# Patient Record
Sex: Male | Born: 1960 | Race: White | Hispanic: No | Marital: Married | Smoking: Never smoker
Health system: Southern US, Community
[De-identification: ages and names within clinical notes are randomized; demographics above are authoritative.]

## PROBLEM LIST (undated history)

## (undated) DIAGNOSIS — E7800 Pure hypercholesterolemia, unspecified: Secondary | ICD-10-CM

## (undated) DIAGNOSIS — I10 Essential (primary) hypertension: Secondary | ICD-10-CM

## (undated) DIAGNOSIS — M722 Plantar fascial fibromatosis: Secondary | ICD-10-CM

## (undated) DIAGNOSIS — M5126 Other intervertebral disc displacement, lumbar region: Secondary | ICD-10-CM

## (undated) HISTORY — PX: CARDIAC CATHETERIZATION: SHX172

## (undated) HISTORY — DX: Other intervertebral disc displacement, lumbar region: M51.26

## (undated) HISTORY — PX: INGUINAL LYMPH NODE BIOPSY: SHX5865

---

## 1999-03-06 ENCOUNTER — Inpatient Hospital Stay (HOSPITAL_COMMUNITY): Admission: EM | Admit: 1999-03-06 | Discharge: 1999-03-07 | Payer: Self-pay | Admitting: Emergency Medicine

## 2002-01-21 ENCOUNTER — Encounter: Admission: RE | Admit: 2002-01-21 | Discharge: 2002-01-21 | Payer: Self-pay | Admitting: Internal Medicine

## 2002-01-21 ENCOUNTER — Encounter: Payer: Self-pay | Admitting: Internal Medicine

## 2005-04-29 ENCOUNTER — Ambulatory Visit: Payer: Self-pay | Admitting: Internal Medicine

## 2005-05-05 ENCOUNTER — Ambulatory Visit: Payer: Self-pay | Admitting: Internal Medicine

## 2005-09-30 ENCOUNTER — Ambulatory Visit: Payer: Self-pay | Admitting: Internal Medicine

## 2006-05-04 ENCOUNTER — Ambulatory Visit: Payer: Self-pay | Admitting: Internal Medicine

## 2006-05-20 ENCOUNTER — Ambulatory Visit: Payer: Self-pay | Admitting: Internal Medicine

## 2006-06-18 ENCOUNTER — Ambulatory Visit: Payer: Self-pay | Admitting: Internal Medicine

## 2006-06-18 LAB — CONVERTED CEMR LAB
ALT: 22 units/L (ref 0–40)
AST: 17 units/L (ref 0–37)
Chol/HDL Ratio, serum: 5
Cholesterol: 193 mg/dL (ref 0–200)
HDL: 38.3 mg/dL — ABNORMAL LOW (ref >39.0)
LDL Cholesterol: 132 mg/dL — ABNORMAL HIGH (ref 0–99)
Triglyceride fasting, serum: 116 mg/dL (ref 0–149)
VLDL: 23 mg/dL (ref 0–40)

## 2006-10-03 ENCOUNTER — Emergency Department (HOSPITAL_COMMUNITY): Admission: EM | Admit: 2006-10-03 | Discharge: 2006-10-03 | Payer: Self-pay | Admitting: Emergency Medicine

## 2007-07-14 ENCOUNTER — Ambulatory Visit: Payer: Self-pay | Admitting: Internal Medicine

## 2007-07-14 LAB — CONVERTED CEMR LAB
ALT: 22 units/L (ref 0–53)
AST: 20 units/L (ref 0–37)
Albumin: 4.4 g/dL (ref 3.5–5.2)
Alkaline Phosphatase: 46 units/L (ref 39–117)
BUN: 17 mg/dL (ref 6–23)
Basophils Absolute: 0 10*3/uL (ref 0.0–0.1)
Basophils Relative: 0.6 % (ref 0.0–1.0)
Bilirubin Urine: NEGATIVE
Bilirubin, Direct: 0.2 mg/dL (ref 0.0–0.3)
CO2: 32 meq/L (ref 19–32)
Calcium: 9.8 mg/dL (ref 8.4–10.5)
Chloride: 101 meq/L (ref 96–112)
Cholesterol: 250 mg/dL (ref 0–200)
Creatinine, Ser: 1.2 mg/dL (ref 0.4–1.5)
Direct LDL: 150.1 mg/dL
Eosinophils Absolute: 0.1 10*3/uL (ref 0.0–0.6)
Eosinophils Relative: 2.4 % (ref 0.0–5.0)
GFR calc Af Amer: 85 mL/min
GFR calc non Af Amer: 70 mL/min
Glucose, Bld: 105 mg/dL — ABNORMAL HIGH (ref 70–99)
HCT: 42.4 % (ref 39.0–52.0)
HDL: 36.9 mg/dL — ABNORMAL LOW (ref >39.0)
Hemoglobin, Urine: NEGATIVE
Hemoglobin: 14.9 g/dL (ref 13.0–17.0)
Ketones, ur: NEGATIVE mg/dL
Leukocytes, UA: NEGATIVE
Lymphocytes Relative: 29 % (ref 12.0–46.0)
MCHC: 35.2 g/dL (ref 30.0–36.0)
MCV: 89.3 fL (ref 78.0–100.0)
Monocytes Absolute: 0.4 10*3/uL (ref 0.2–0.7)
Monocytes Relative: 8.6 % (ref 3.0–11.0)
Neutro Abs: 3.2 10*3/uL (ref 1.4–7.7)
Neutrophils Relative %: 59.4 % (ref 43.0–77.0)
Nitrite: NEGATIVE
Platelets: 199 10*3/uL (ref 150–400)
Potassium: 4.7 meq/L (ref 3.5–5.1)
RBC: 4.75 M/uL (ref 4.22–5.81)
RDW: 12 % (ref 11.5–14.6)
Sodium: 139 meq/L (ref 135–145)
Specific Gravity, Urine: 1.01 (ref 1.000–1.03)
TSH: 1.54 microintl units/mL (ref 0.35–5.50)
Total Bilirubin: 0.9 mg/dL (ref 0.3–1.2)
Total CHOL/HDL Ratio: 6.8
Total Protein, Urine: NEGATIVE mg/dL
Total Protein: 7.3 g/dL (ref 6.0–8.3)
Triglycerides: 241 mg/dL (ref 0–149)
Urine Glucose: NEGATIVE mg/dL
Urobilinogen, UA: 0.2 (ref 0.0–1.0)
VLDL: 48 mg/dL — ABNORMAL HIGH (ref 0–40)
WBC: 5.2 10*3/uL (ref 4.5–10.5)
pH: 6.5 (ref 5.0–8.0)

## 2007-07-16 ENCOUNTER — Encounter: Payer: Self-pay | Admitting: Internal Medicine

## 2007-07-20 ENCOUNTER — Encounter: Payer: Self-pay | Admitting: Internal Medicine

## 2007-07-20 DIAGNOSIS — Z9889 Other specified postprocedural states: Secondary | ICD-10-CM

## 2007-07-20 DIAGNOSIS — E785 Hyperlipidemia, unspecified: Secondary | ICD-10-CM

## 2007-07-20 DIAGNOSIS — R030 Elevated blood-pressure reading, without diagnosis of hypertension: Secondary | ICD-10-CM

## 2007-07-20 DIAGNOSIS — J309 Allergic rhinitis, unspecified: Secondary | ICD-10-CM | POA: Insufficient documentation

## 2007-07-21 ENCOUNTER — Ambulatory Visit: Payer: Self-pay | Admitting: Internal Medicine

## 2007-08-20 ENCOUNTER — Emergency Department (HOSPITAL_COMMUNITY): Admission: EM | Admit: 2007-08-20 | Discharge: 2007-08-20 | Payer: Self-pay | Admitting: Emergency Medicine

## 2007-08-26 ENCOUNTER — Ambulatory Visit: Payer: Self-pay | Admitting: Internal Medicine

## 2007-08-26 DIAGNOSIS — R079 Chest pain, unspecified: Secondary | ICD-10-CM

## 2007-08-30 ENCOUNTER — Ambulatory Visit: Payer: Self-pay

## 2007-09-03 ENCOUNTER — Telehealth: Payer: Self-pay | Admitting: Internal Medicine

## 2008-10-31 ENCOUNTER — Ambulatory Visit: Payer: Self-pay | Admitting: Internal Medicine

## 2008-10-31 DIAGNOSIS — M25519 Pain in unspecified shoulder: Secondary | ICD-10-CM

## 2009-01-09 ENCOUNTER — Ambulatory Visit: Payer: Self-pay | Admitting: Internal Medicine

## 2009-01-09 LAB — CONVERTED CEMR LAB
ALT: 24 units/L (ref 0–53)
AST: 21 units/L (ref 0–37)
Albumin: 4.4 g/dL (ref 3.5–5.2)
Alkaline Phosphatase: 40 units/L (ref 39–117)
BUN: 23 mg/dL (ref 6–23)
Basophils Absolute: 0 10*3/uL (ref 0.0–0.1)
Basophils Relative: 0.5 % (ref 0.0–3.0)
Bilirubin Urine: NEGATIVE
Bilirubin, Direct: 0.2 mg/dL (ref 0.0–0.3)
CO2: 30 meq/L (ref 19–32)
Calcium: 9.8 mg/dL (ref 8.4–10.5)
Chloride: 107 meq/L (ref 96–112)
Cholesterol: 225 mg/dL — ABNORMAL HIGH (ref 0–200)
Creatinine, Ser: 1.1 mg/dL (ref 0.4–1.5)
Direct LDL: 146.5 mg/dL
Eosinophils Absolute: 0.2 10*3/uL (ref 0.0–0.7)
Eosinophils Relative: 3.9 % (ref 0.0–5.0)
GFR calc non Af Amer: 76.46 mL/min (ref >60)
Glucose, Bld: 106 mg/dL — ABNORMAL HIGH (ref 70–99)
HCT: 43.8 % (ref 39.0–52.0)
HDL: 46.1 mg/dL (ref >39.00)
Hemoglobin, Urine: NEGATIVE
Hemoglobin: 15.3 g/dL (ref 13.0–17.0)
Ketones, ur: NEGATIVE mg/dL
Leukocytes, UA: NEGATIVE
Lymphocytes Relative: 31 % (ref 12.0–46.0)
Lymphs Abs: 1.5 10*3/uL (ref 0.7–4.0)
MCHC: 34.8 g/dL (ref 30.0–36.0)
MCV: 92 fL (ref 78.0–100.0)
Monocytes Absolute: 0.4 10*3/uL (ref 0.1–1.0)
Monocytes Relative: 8.4 % (ref 3.0–12.0)
Neutro Abs: 2.9 10*3/uL (ref 1.4–7.7)
Neutrophils Relative %: 56.2 % (ref 43.0–77.0)
Nitrite: NEGATIVE
PSA: 0.83 ng/mL (ref 0.10–4.00)
Platelets: 188 10*3/uL (ref 150.0–400.0)
Potassium: 4.9 meq/L (ref 3.5–5.1)
RBC: 4.77 M/uL (ref 4.22–5.81)
RDW: 12.6 % (ref 11.5–14.6)
Sodium: 141 meq/L (ref 135–145)
Specific Gravity, Urine: 1.015 (ref 1.000–1.030)
TSH: 1.41 microintl units/mL (ref 0.35–5.50)
Total Bilirubin: 1.2 mg/dL (ref 0.3–1.2)
Total CHOL/HDL Ratio: 5
Total Protein, Urine: NEGATIVE mg/dL
Total Protein: 7.4 g/dL (ref 6.0–8.3)
Triglycerides: 189 mg/dL — ABNORMAL HIGH (ref 0.0–149.0)
Urine Glucose: NEGATIVE mg/dL
Urobilinogen, UA: 0.2 (ref 0.0–1.0)
VLDL: 37.8 mg/dL (ref 0.0–40.0)
WBC: 5 10*3/uL (ref 4.5–10.5)
pH: 5.5 (ref 5.0–8.0)

## 2009-01-18 ENCOUNTER — Ambulatory Visit: Payer: Self-pay | Admitting: Internal Medicine

## 2010-12-27 NOTE — Assessment & Plan Note (Signed)
[facility]                             PRIMARY CARE OFFICE NOTE   Patrick Moore, Patrick Moore                      MRN:          [id number]  DATE:05/21/2006                            DOB:          [birth year]    HISTORY OF PRESENT ILLNESS:  Patrick Moore is a very pleasant 50 year old  gentleman who presents for follow up evaluation and exam.  He was last seen  in the office on September 30, 2005 for abdominal pain with muscle pain and  allergic rhinitis.   Please see my note of May 05, 2005, for complete past medical history,  family history and social history.   INTERVAL SOCIAL HISTORY:  The patient's work is good.  He is looking for  partnership.  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  His  marriage is in great health.   CURRENT MEDICATIONS:  1. Multivitamin only.  2. He has had weight loss of approximately 20 pounds, and subsequently has      stopped his HCTZ and remains normotensive.   REVIEW OF SYSTEMS:  The patient has had a 20 pound weight loss that was  intentional, but no other constitutional problems.  He has not had an eye  exam for more than 24 months.  No ENT, Cardiovascular, Respiratory, GI, GU  problems.  He does have some low back pain.  He does have crepitus in both  knees.   PHYSICAL EXAMINATION:  VITAL SIGNS:  Temperature 99.1, blood pressure  121/81, pulse 69, weight 196.  GENERAL:  He is a well-nourished, athletic appearing gentleman, looks his  stated age in no acute distress.  HEENT:  Normocephalic, atraumatic.  EAC's and TM's are normal.  Oropharynx  was negative.  Dentition in good repair.  No buccal or palate lesions were  noted.  Posterior pharynx was clear.  Conjunctivae and sclerae were clear.  PERRLA.  EOMI.  Funduscopic examination was unremarkable.  NECK:  Supple without thyromegaly.  NODES:  No adenopathy was noted in the  cervical or supraclavicular regions.  CHEST:  No CVA tenderness.  LUNGS:  Clear to auscultation and percussion.  CARDIOVASCULAR:  Peripheral pulses 2+.  No JVD or carotid bruits.  Had a  quite precordium with regular rate and rhythm without murmurs, rubs or  gallops.  ABDOMEN:  Soft, no guarding or rebound.  No organosplenomegaly was  appreciated.  GENITALIA:  Normal male thallus bilaterally.  He has testicles without  masses.  RECTAL:  Normal sphincter tone, prostate was flat, smooth, no nodules were  appreciated.  EXTREMITIES:  Without cyanosis, clubbing, edema or deformity.  NEUROLOGICAL:  Nonfocal.  SKIN:  Clear.   DATABASE ON ADMISSION:  Hemoglobin 14.6 g, white count 4700 with normal  differential.  Cholesterol 237, triglycerides 164, HDL 39, LDL 167.9.  Chemistries:  Glucose 117.  Electrolytes were normal.  Kidney function  normal.  Creatinine 1.2 and GFR 70.  Liver functions were normal.  Thyroid  function normal.  TSH 1.66.  Urinalysis negative.   ASSESSMENT/PLAN:  1. Lipids.  Discussed this at length with the patient.  Talked about      cardiac risk profile which was positive for being male gender having      hyperlipidemia and also having a positive family history for both      diabetes, hyperlipidemia and heart disease.  The patient has been      following a very careful diet in regards to fat content.  He is very      active and exercises on a regular basis.  Plan to start the patient on      Crestor 5 mg daily.  He is to return to 3-1/2 weeks for lipid panel.      We will adjust his medications as needed to attain and LDL goal of less      than 130 and closer to 100 if possible.  2. Hyperglycemia.  The patient does have an abnormally elevated serum      glucose at 117.  He has a positive family history for diabetes.  We did      discuss this at length.  The patient is to follow a no concentrated      sweets, low carbohydrate diet.  3. MSK.  Patient with minimal  crepitus of his knees.  He has no limitation      in his activities.  He has chronic low back pain which he manages and      does regular exercising.  4. Health maintenance.  The patient is given a tetanus booster at today's      visit.  He is otherwise current and up to date.  5. Pleasant gentleman who is now being treated for hyperlipidemia and will      return for laboratories as noted and notified by phone tree the test      results.            ______________________________  Rosalyn Gess Norins, MD      MEN/MedQ  DD:  05/21/2006  DT:  05/22/2006  Job #:  [id number]   cc:   Sharia Reeve

## 2011-07-01 ENCOUNTER — Other Ambulatory Visit (HOSPITAL_COMMUNITY): Payer: Self-pay | Admitting: Family Medicine

## 2011-07-01 DIAGNOSIS — R109 Unspecified abdominal pain: Secondary | ICD-10-CM

## 2011-07-04 ENCOUNTER — Ambulatory Visit (HOSPITAL_COMMUNITY)
Admission: RE | Admit: 2011-07-04 | Discharge: 2011-07-04 | Disposition: A | Discharge status: Home / Self Care | Payer: 59 | Source: Ambulatory Visit | Attending: Family Medicine | Admitting: Family Medicine

## 2011-07-04 ENCOUNTER — Other Ambulatory Visit (HOSPITAL_COMMUNITY): Payer: Self-pay | Admitting: Family Medicine

## 2011-07-04 DIAGNOSIS — R109 Unspecified abdominal pain: Secondary | ICD-10-CM

## 2011-07-04 DIAGNOSIS — K4020 Bilateral inguinal hernia, without obstruction or gangrene, not specified as recurrent: Secondary | ICD-10-CM | POA: Insufficient documentation

## 2011-07-04 DIAGNOSIS — Q619 Cystic kidney disease, unspecified: Secondary | ICD-10-CM | POA: Insufficient documentation

## 2011-07-04 DIAGNOSIS — R1031 Right lower quadrant pain: Secondary | ICD-10-CM | POA: Insufficient documentation

## 2011-07-04 MED ORDER — IOHEXOL 300 MG/ML  SOLN
100.0000 mL | Freq: Once | INTRAMUSCULAR | Status: AC | PRN
  Administered 2011-07-04: 100 mL via INTRAVENOUS

## 2012-11-29 ENCOUNTER — Telehealth: Payer: Self-pay

## 2012-11-29 NOTE — Telephone Encounter (Signed)
Pt was referred by Dr. Phillips Odor for screening colonoscopy. LMOM to call.

## 2012-12-01 NOTE — Telephone Encounter (Signed)
LM for pt to call

## 2012-12-13 NOTE — Telephone Encounter (Signed)
Letter to pt and PCP.  

## 2013-10-06 ENCOUNTER — Emergency Department (HOSPITAL_COMMUNITY)
Admission: EM | Admit: 2013-10-06 | Discharge: 2013-10-06 | Disposition: A | Discharge status: Home / Self Care | Payer: 59 | Source: Home / Self Care | Attending: Emergency Medicine | Admitting: Emergency Medicine

## 2013-10-06 ENCOUNTER — Encounter (HOSPITAL_COMMUNITY): Payer: Self-pay | Admitting: Emergency Medicine

## 2013-10-06 DIAGNOSIS — M5430 Sciatica, unspecified side: Secondary | ICD-10-CM

## 2013-10-06 MED ORDER — METHYLPREDNISOLONE ACETATE 80 MG/ML IJ SUSP
INTRAMUSCULAR | Status: AC
  Filled 2013-10-06: qty 1

## 2013-10-06 MED ORDER — METHYLPREDNISOLONE ACETATE 80 MG/ML IJ SUSP
80.0000 mg | Freq: Once | INTRAMUSCULAR | Status: AC
  Administered 2013-10-06: 80 mg via INTRAMUSCULAR

## 2013-10-06 MED ORDER — PREDNISONE 20 MG PO TABS: ORAL_TABLET | ORAL | Status: DC

## 2013-10-06 MED ORDER — HYDROCODONE-ACETAMINOPHEN 5-325 MG PO TABS: ORAL_TABLET | ORAL | Status: DC

## 2013-10-06 MED ORDER — KETOROLAC TROMETHAMINE 60 MG/2ML IM SOLN
60.0000 mg | Freq: Once | INTRAMUSCULAR | Status: AC
  Administered 2013-10-06: 60 mg via INTRAMUSCULAR

## 2013-10-06 MED ORDER — KETOROLAC TROMETHAMINE 60 MG/2ML IM SOLN
INTRAMUSCULAR | Status: AC
  Filled 2013-10-06: qty 2

## 2013-10-06 NOTE — ED Provider Notes (Signed)
  Chief Complaint   Chief Complaint  Patient presents with  . Back Pain    History of Present Illness   Patrick Moore is a 53 year old male who has had a five-day history of lower back pain with radiation down the left leg as far as the ankle. He denies any injury to the back. The pain is worse with sitting or bending. He denies any numbness, tingling, weakness in lower extremities. No bladder or bowel dysfunction. No saddle anesthesia. He denies any abdominal pain, fever, chills, or intent weight loss. He's also had some popping of his neck and mild aching on the left side.  Review of Systems   Other than as noted above, the patient denies any of the following symptoms: Systemic:  No fever, chills, or unexplained weight loss. GI:  No abdominal painor incontinence of bowel. GU:  No dysuria, frequency, urgency, or hematuria. No incontinence of urine or urinary retention.  M-S:  No neck pain or arthritis. Neuro:  No paresthesias, headache, saddle anesthesia, muscular weakness, or progressive neurological deficit.  [place]   Past medical history, family history, social history, meds, and allergies were reviewed. Specifically, there is no history of cancer, major trauma, osteoporosis, immunosuppression, or HIV infection.   Physical Examination    Vital signs:  BP 158/102  Pulse 84  Temp(Src) 97.8 F (36.6 C) (Oral)  Resp 18  SpO2 98% General:  Alert, oriented, in no distress. Abdomen:  Soft, non-tender.  No organomegaly or mass.  No pulsatile midline abdominal mass or bruit. Back:  There was minimal tenderness to palpation in the back. He has about 45 of flexion, 20 extension, 20 of lateral bending, and 45 of rotation. Straight leg raising is positive on the left with a positive Lasegue's sign and positive popliteal compression. Neuro:  Normal muscle strength, sensations and DTRs. Extremities: Pedal pulses were full, there was no edema. Skin:  Clear, warm and dry.  No rash.    Course in [facility] [place]   He was given Toradol 60 mg IM and Depo-Medrol 80 mg IM.    Assessment   The encounter diagnosis was Sciatica.  Plan     1.  Meds:  The following meds were prescribed:   Discharge Medication List as of 10/06/2013 10:37 AM    START taking these medications   Details  HYDROcodone-acetaminophen (NORCO/VICODIN) 5-325 MG per tablet 1 to 2 tabs every 4 to 6 hours as needed for pain., Print    predniSONE (DELTASONE) 20 MG tablet Take 3 daily for 5 days, 2 daily for 5 days, 1 daily for 5 days., Normal        2.  Patient Education/Counseling:  The patient was given appropriate handouts, self care instructions, and instructed in symptomatic relief. The patient was encouraged to try to be as active as possible and given some exercises to do followed by moist heat.  3.  Follow up:  The patient was told to follow up here if no better in 3 to 4 days, or sooner if becoming worse in any way, and given some red flag symptoms such as worsening pain or new neurological symptoms which would prompt immediate return.  Follow up with Dr. Fredonia Highland if no better in 2 weeks.     Harden Mo, MD 10/06/13 [phone]

## 2013-10-06 NOTE — ED Notes (Signed)
Pt  Reports    l  Sided  Back  Pain        X  4  Days     denys  Any  specefic  Injury       Pt  Reports   Some  Neck pain  As  Well         Pt  denys  Any  Urinary  Problems

## 2013-10-06 NOTE — Discharge Instructions (Signed)
Do exercises twice daily followed by moist heat for 15 minutes. ° ° ° ° ° °Try to be as active as possible. ° °If no better in 2 weeks, follow up with orthopedist. ° ° °

## 2014-01-26 ENCOUNTER — Other Ambulatory Visit (HOSPITAL_COMMUNITY): Payer: Self-pay | Admitting: Family Medicine

## 2014-01-26 DIAGNOSIS — R5381 Other malaise: Secondary | ICD-10-CM

## 2014-01-26 DIAGNOSIS — R5383 Other fatigue: Secondary | ICD-10-CM

## 2014-01-26 DIAGNOSIS — Z0000 Encounter for general adult medical examination without abnormal findings: Secondary | ICD-10-CM

## 2014-01-30 ENCOUNTER — Ambulatory Visit (HOSPITAL_COMMUNITY)
Admission: RE | Admit: 2014-01-30 | Discharge: 2014-01-30 | Disposition: A | Discharge status: Home / Self Care | Payer: 59 | Source: Ambulatory Visit | Attending: Family Medicine | Admitting: Family Medicine

## 2014-01-30 DIAGNOSIS — R7611 Nonspecific reaction to tuberculin skin test without active tuberculosis: Secondary | ICD-10-CM | POA: Insufficient documentation

## 2014-01-30 DIAGNOSIS — R5381 Other malaise: Secondary | ICD-10-CM

## 2014-01-30 DIAGNOSIS — Z0000 Encounter for general adult medical examination without abnormal findings: Secondary | ICD-10-CM

## 2014-01-30 DIAGNOSIS — R5383 Other fatigue: Secondary | ICD-10-CM

## 2014-05-29 ENCOUNTER — Telehealth: Payer: Self-pay

## 2014-05-29 NOTE — Telephone Encounter (Signed)
PATIENT CALLED TO SCHEDULE A COLONOSCOPY  PLEASE CALL [phone] WORK   ASK FOR HIM

## 2014-06-05 ENCOUNTER — Telehealth: Payer: Self-pay | Admitting: Orthopedic Surgery

## 2014-06-05 NOTE — Telephone Encounter (Signed)
Noted. Patient is aware. 

## 2014-06-05 NOTE — Telephone Encounter (Signed)
Patient is asking to be seen as a new patient for sciatica problems that is affecting his right leg. He fell back in Feb 2015 on ice and he went to [facility] no x-rays were taken at the time, only treated with steroids. Is this something that can be treated here or should he go to spine specialist

## 2014-06-05 NOTE — Telephone Encounter (Signed)
The patient should see his primary care physician or a physician who is a specialist in lumbar spine conditions such as Dr. Carloyn Manner or [facility] neurosurgery

## 2014-06-06 ENCOUNTER — Other Ambulatory Visit: Payer: Self-pay

## 2014-06-06 DIAGNOSIS — Z1211 Encounter for screening for malignant neoplasm of colon: Secondary | ICD-10-CM

## 2014-06-06 NOTE — Telephone Encounter (Addendum)
Gastroenterology Pre-Procedure Review  Request Date: 06/06/2014 Requesting Physician: Dr. Orson Ape  PATIENT REVIEW QUESTIONS: The patient responded to the following health history questions as indicated:    Pt drinks 12 pack beer on week-ends  1. Diabetes Melitis: no 2. Joint replacements in the past 12 months: no 3. Major health problems in the past 3 months: no 4. Has an artificial valve or MVP: no 5. Has a defibrillator: no 6. Has been advised in past to take antibiotics in advance of a procedure like teeth cleaning: no    MEDICATIONS & ALLERGIES:    Patient reports the following regarding taking any blood thinners:   Plavix? no Aspirin? no Coumadin? no  Patient confirms/reports the following medications:  Current Outpatient Prescriptions  Medication Sig Dispense Refill  . rosuvastatin (CRESTOR) 20 MG tablet Take 20 mg by mouth daily.       No current facility-administered medications for this visit.    Patient confirms/reports the following allergies:  No Known Allergies  No orders of the defined types were placed in this encounter.    AUTHORIZATION INFORMATION Primary Insurance:  ID #:  Group #:  Pre-Cert / Auth required:  Pre-Cert / Auth #:   Secondary Insurance:   ID #:   Group #:  Pre-Cert / Auth required:  Pre-Cert / Auth #:   SCHEDULE INFORMATION: Procedure has been scheduled as follows:  Date: 07/26/2014            Time:  7:30 AM Location: [facility] Short Stay  This Gastroenterology Pre-Precedure Review Form is being routed to the following provider(s): R. Garfield Cornea, MD

## 2014-06-07 NOTE — Telephone Encounter (Signed)
Ok to schedule.

## 2014-06-07 NOTE — Addendum Note (Signed)
Addended by: Mahala Menghini on: 06/07/2014 02:01 PM   Modules accepted: Orders

## 2014-06-08 MED ORDER — PEG-KCL-NACL-NASULF-NA ASC-C 100 G PO SOLR: 1.0000 | ORAL | Status: DC

## 2014-06-08 NOTE — Telephone Encounter (Signed)
Rx sent to the pharmacy and instructions mailed to pt.  

## 2014-06-08 NOTE — Addendum Note (Signed)
Addended by: Everardo All on: 06/08/2014 03:30 PM   Modules accepted: Orders

## 2014-06-12 ENCOUNTER — Ambulatory Visit (HOSPITAL_COMMUNITY): Payer: 59 | Attending: Emergency Medicine

## 2014-06-12 ENCOUNTER — Emergency Department (HOSPITAL_COMMUNITY)
Admission: EM | Admit: 2014-06-12 | Discharge: 2014-06-12 | Disposition: A | Discharge status: Home / Self Care | Payer: 59 | Source: Home / Self Care | Attending: Emergency Medicine | Admitting: Emergency Medicine

## 2014-06-12 ENCOUNTER — Encounter (HOSPITAL_COMMUNITY): Payer: Self-pay | Admitting: Emergency Medicine

## 2014-06-12 DIAGNOSIS — M722 Plantar fascial fibromatosis: Secondary | ICD-10-CM

## 2014-06-12 DIAGNOSIS — M79659 Pain in unspecified thigh: Secondary | ICD-10-CM

## 2014-06-12 DIAGNOSIS — M79651 Pain in right thigh: Secondary | ICD-10-CM | POA: Diagnosis present

## 2014-06-12 DIAGNOSIS — S76311A Strain of muscle, fascia and tendon of the posterior muscle group at thigh level, right thigh, initial encounter: Secondary | ICD-10-CM

## 2014-06-12 LAB — D-DIMER, QUANTITATIVE: D-Dimer, Quant: <0.27 ug/mL-FEU (ref 0.00–0.48)

## 2014-06-12 MED ORDER — DICLOFENAC SODIUM 75 MG PO TBEC: 75.0000 mg | DELAYED_RELEASE_TABLET | Freq: Two times a day (BID) | ORAL | Status: DC

## 2014-06-12 MED ORDER — OXYCODONE-ACETAMINOPHEN 5-325 MG PO TABS: ORAL_TABLET | ORAL | Status: DC

## 2014-06-12 MED ORDER — HYDROCODONE-ACETAMINOPHEN 5-325 MG PO TABS
ORAL_TABLET | ORAL | Status: AC
  Filled 2014-06-12: qty 2

## 2014-06-12 MED ORDER — HYDROCODONE-ACETAMINOPHEN 5-325 MG PO TABS
2.0000 | ORAL_TABLET | Freq: Once | ORAL | Status: AC
  Administered 2014-06-12: 2 via ORAL

## 2014-06-12 MED ORDER — CYCLOBENZAPRINE HCL 5 MG PO TABS: 5.0000 mg | ORAL_TABLET | Freq: Three times a day (TID) | ORAL | Status: DC | PRN

## 2014-06-12 NOTE — Discharge Instructions (Signed)
Plantar fasciitis is a tendonitis (inflammed tendon) of the the plantar fascia, the tendon on the bottom of the foot that supports the arch.  Often the pain is localized to the heel and can be worse first thing in the morning after getting up or after sitting for a long period of time and tends to improve as the day goes by, only to worsen later on in the afternoon or evening after you have been on your feet for a long time.  Following the program outlined below cures most cases.  If conservative measures like these don't work, corticosteroid injection, or referral to a podiatrist are other options.   Wear well fitting, lace up shoes with good arch support.  Tennis or running shoes are the best.  Do not wear heels, flip-flops, scuffs, or any kind of shoe without adequate support.    Use a heel lift.  These can be purchased at a shoe store or pharmacy.  They come in various price ranges.  Some people find that an orthotic insert with arch support works better.  Wear a night splint.  These can be purchased on line at www.PlayMommy.fr.  The product to look for is the "Freedom Dorsal Night Splint."  [employer] also sells other products for plantar fasciitis including heel lifts and orthotic inserts.  Use of over the counter pain meds can be of help.  Tylenol (or acetaminophen) is the safest to use.  It often helps to take this regularly.  You can take up to 2 325 mg tablets 5 times daily, but it best to start out much lower that that, perhaps 2 325 mg tablets twice daily, then increase from there. People who are on the blood thinner warfarin have to be careful about taking high doses of Tylenol.  For people who are able to tolerate them, ibuprofen and naproxyn can also help with the pain.  You should discuss these agents with your physician before taking them.  People with chronic kidney disease, hypertension, peptic ulcer disease, and reflux can suffer adverse side effects. They should not be taken with warfarin.  The maximum dosage of ibuprofen is 800 mg 3 times daily with meals.  The maximum dosage of naprosyn is 2 and 1/2 tablets twice daily with food, but again, start out low and gradually increase the dose until adequate pain relief is achieved. Ibuprofen and naprosyn should always be taken with food.  Ice massage is helpful.  The best way to apply this is to freeze a bottle of water, place in on a towel and roll your foot over the bottle to produce an ice massage effect. This can be done as often as every 2 to 3 hours, depending on symptoms.  At the very least, try to do after you have been on your feet for a long period of time and after doing the exercises outlined below.  Do the exercises outlined below twice daily:                   Hamstring Strain  Hamstrings are the large muscles in the back of the thighs. A strain or tear injury happens when there is a sudden stretch or pull on these muscles and tendons. Tendons are cord like structures that attach muscle to bone. These injuries are commonly seen in activities such as sprinting due to sudden acceleration.  DIAGNOSIS  Often the diagnosis can be made by examination. HOME CARE INSTRUCTIONS   Apply ice to the sore area for  15-83minutes, 03-04 times per day. Do this while awake for the first 2 days. Put the ice in a plastic bag, and place a towel between the bag of ice and your skin.  Keep your knee flexed when possible. This means your foot is held off the ground slightly if you are on crutches. When lying down, a pillow under the knee will take strain off the muscles and provide some relief.  If a compression bandage such as an ace wrap was applied, use it until you are seen again. You may remove it for sleeping, showers and baths. If the wrap seems to be too tight and is uncomfortable, wrap it more loosely. If your toes or foot are getting cold or blue, it is too tight.  Walk or move around as the pain allows, or as instructed.  Resume full activities as suggested by your caregiver. This is often safest when the strength of the injured leg has nearly returned to normal.  Only take over-the-counter or prescription medicines for pain, discomfort, or fever as directed by your caregiver. SEEK MEDICAL CARE IF:   You have an increase in bruising, swelling or pain.  You notice coldness or blueness of your toes or foot.  Pain relief is not obtained with medications.  You have increasing pain in the area and seem to be getting worse rather than better.  You notice your thigh getting larger in size (this could indicate bleeding into the muscle). Document Released: 04/22/2001 Document Revised: 10/20/2011 Document Reviewed: 07/30/2008 [facility] Patient Information 2015 [place], [place]. This information is not intended to replace advice given to you by your health care provider. Make sure you discuss any questions you have with your health care provider.

## 2014-06-12 NOTE — ED Notes (Signed)
Patient going to radiology at [facility] for xray.

## 2014-06-12 NOTE — ED Provider Notes (Signed)
Chief Complaint    Spasms   History of Present Illness     Patrick Moore is a 53 year old male who presents with a one-month history of bilateral heel pain and a two-week history of right posterior thigh pain the patient first had bilateral plantar heel pain. This began in the right side since moved to the left. He denies any injury to the ankles and heels. There was no pain in the Achilles tendons. He denies any swelling. There is no numbness or tingling in the feet and no muscle weakness in the lower extremities. This is still going on, but then over the past 2 weeks he developed pain in the right posterior thigh. He denies any injury. He is not playing any sports. The pain is worse with straight leg raising, or stretching. It does not radiate down into the calf or the lower leg. He denies any lower back pain. There is no numbness, tingling, or muscle weakness.  Review of Systems     Other than as noted above, the patient denies any of the following symptoms: Systemic:  No fevers or chills.   Musculoskeletal:  No arthritis, back pain, or neck pain. Neurological:  No muscular weakness or paresthesias.  [place]    Past medical history, family history, social history, meds, and allergies were reviewed.    Physical Exam    Vital signs:  BP 170/108 mmHg  Pulse 79  Temp(Src) 98.2 F (36.8 C) (Oral)  Resp 18  SpO2 99% Gen:  Alert and oriented times 3.  In no distress. Musculoskeletal: Exam of the feet reveals pain to palpation over the plantar surfaces of both heels. There is no pain over the Achilles. He has slight pain to palpation over the lateral calcaneus bilaterally. There is no ankle tenderness to palpation and full range of motion. There is no pain to palpation over the dorsum of the foot, no swelling, pulses are full, and no erythema. Exam of the hip and knee reveal full range of motion of both but with pain on straight leg raising referred to the posterior hamstring area. There is  pain to palpation over the posterior thigh. There is no palpable muscle defect, no bruising, mass, or palpable swelling.  Otherwise, all joints had a full a ROM with no swelling, bruising or deformity.  No edema, pulses full. Extremities were warm and pink.  Capillary refill was brisk.  Skin:  Clear, warm and dry.  No rash. Neuro:  Alert and oriented times 3.  Muscle strength was normal.  Sensation was intact to light touch.    Labs   Results for orders placed or performed during the hospital encounter of 06/12/14  D-dimer, quantitative  Result Value Ref Range   D-Dimer, Quant <0.27 0.00 - 0.48 ug/mL-FEU   Radiology     Dg Femur Right  06/12/2014   CLINICAL DATA:  Right mid femur pain posteriorly for 2 weeks without history of injury  EXAM: RIGHT FEMUR - 2 VIEW  COMPARISON:  None.  FINDINGS: The right femur is adequately mineralized. There is no lytic or blastic lesion nor periosteal reaction. The surrounding soft tissues are normal. The observed portions of the right hip and of the right knee are normal.  IMPRESSION: There is no acute bony abnormality of the right femur.   Electronically Signed   By: Duwan Adrian  [place]   On: 06/12/2014 12:06   I reviewed the images independently and personally and concur with the radiologist's findings.   [facility] in  [facility]   The following medications were given:  Medications  HYDROcodone-acetaminophen (NORCO/VICODIN) 5-325 MG per tablet 2 tablet (2 tablets Oral Given 06/12/14 1127)   Assessment    The primary encounter diagnosis was Hamstring strain, right, initial encounter. Diagnoses of Thigh pain and Plantar fasciitis were also pertinent to this visit.  The heel pain seems to be clearcut plantar fasciitis. Differential diagnosis of the hamstring pain in his hamstring strain versus lumbar radiculopathy. I have advised referral to podiatry if no improvement in 2 weeks, and referral to orthopedics if the hamstring pain isn't better in 3-4  weeks.  Plan   1.  Meds:  The following meds were prescribed:   Discharge Medication List as of 06/12/2014  1:06 PM    START taking these medications   Details  cyclobenzaprine (FLEXERIL) 5 MG tablet Take 1 tablet (5 mg total) by mouth 3 (three) times daily as needed for muscle spasms., Starting 06/12/2014, Until Discontinued, Normal    diclofenac (VOLTAREN) 75 MG EC tablet Take 1 tablet (75 mg total) by mouth 2 (two) times daily., Starting 06/12/2014, Until Discontinued, Normal    oxyCODONE-acetaminophen (PERCOCET) 5-325 MG per tablet 1 to 2 tablets every 6 hours as needed for pain., Print        2.  Patient Education/Counseling:  The patient was given appropriate handouts, self care instructions, and instructed in pain control, rest and activity, elevation, application of ice and compression.  He was given exercises to do for the plantar fasciitis and for the hamstring strain.  3.  Follow up:  The patient was told to follow up here if no better in 3 to 4 days, or sooner if becoming worse in any way, and given some red flag symptoms such as worsening pain or new neurological symptoms which would prompt immediate return. Follow-up with Dr. Thurmond Butts if the heel pain isn't better in 2 weeks and with Dr. Mardelle Matte if the hamstring pain isn't better in 3-4 weeks.    Harden Mo, MD 06/12/14 [phone]

## 2014-06-12 NOTE — ED Notes (Signed)
Initially had heel pain, onset 2 weeks ago.  Then noticed back of right thigh "muscle spasm".  This to has been going on for 2 weeks.  Onset of movement is very painful.  Thigh significantly more painful than heel pain.  Patient walks on concrete all day, has been treated sciatic pain in the past.  Denies back pain.  No known injury.  Tried to see orthopedic, need to see neurologist.  pcp cannot see patient until Thursday.  Plan to fly to [place] this coming week end

## 2014-06-21 ENCOUNTER — Telehealth: Payer: Self-pay

## 2014-06-21 NOTE — Telephone Encounter (Signed)
LMOM for pt to call to update his triage.

## 2014-07-03 NOTE — Telephone Encounter (Signed)
  Current Outpatient Prescriptions  Medication Sig Dispense Refill  . Fish Oil OIL Take 1 capsule by mouth daily.     . meloxicam (MOBIC) 15 MG tablet Take 15 mg by mouth daily.    . Multiple Vitamin (MULTIVITAMIN) capsule Take 1 capsule by mouth daily.    . pravastatin (PRAVACHOL) 20 MG tablet Take 20 mg by mouth daily.    . peg 3350 powder (MOVIPREP) 100 G SOLR Take 1 kit (200 g total) by mouth as directed. 1 kit 0   No current facility-administered medications for this visit.    OK to schedule.

## 2014-07-03 NOTE — Telephone Encounter (Signed)
Pt returned call and med list was updated for the procedure. He is now taking Meloxicam 15 mg qd. He is on Pravastatin 20 mg and not the Crestor.

## 2014-07-24 ENCOUNTER — Other Ambulatory Visit: Payer: Self-pay

## 2014-07-24 MED ORDER — PEG-KCL-NACL-NASULF-NA ASC-C 100 G PO SOLR: 1.0000 | ORAL | Status: DC

## 2014-07-26 ENCOUNTER — Encounter (HOSPITAL_COMMUNITY)
Admission: RE | Disposition: A | Discharge status: Home / Self Care | Payer: Self-pay | Source: Ambulatory Visit | Attending: Internal Medicine

## 2014-07-26 ENCOUNTER — Ambulatory Visit (HOSPITAL_COMMUNITY)
Admission: RE | Admit: 2014-07-26 | Discharge: 2014-07-26 | Disposition: A | Discharge status: Home / Self Care | Payer: 59 | Source: Ambulatory Visit | Attending: Internal Medicine | Admitting: Internal Medicine

## 2014-07-26 ENCOUNTER — Encounter (HOSPITAL_COMMUNITY): Payer: Self-pay | Admitting: *Deleted

## 2014-07-26 DIAGNOSIS — Z1211 Encounter for screening for malignant neoplasm of colon: Secondary | ICD-10-CM

## 2014-07-26 DIAGNOSIS — D120 Benign neoplasm of cecum: Secondary | ICD-10-CM | POA: Insufficient documentation

## 2014-07-26 DIAGNOSIS — K5730 Diverticulosis of large intestine without perforation or abscess without bleeding: Secondary | ICD-10-CM | POA: Diagnosis not present

## 2014-07-26 DIAGNOSIS — E780 Pure hypercholesterolemia: Secondary | ICD-10-CM | POA: Insufficient documentation

## 2014-07-26 HISTORY — DX: Plantar fascial fibromatosis: M72.2

## 2014-07-26 HISTORY — PX: COLONOSCOPY: SHX5424

## 2014-07-26 HISTORY — DX: Pure hypercholesterolemia, unspecified: E78.00

## 2014-07-26 SURGERY — COLONOSCOPY
Anesthesia: Moderate Sedation

## 2014-07-26 MED ORDER — STERILE WATER FOR IRRIGATION IR SOLN
Status: DC | PRN
  Administered 2014-07-26: 08:00:00

## 2014-07-26 MED ORDER — MEPERIDINE HCL 100 MG/ML IJ SOLN
INTRAMUSCULAR | Status: DC | PRN
  Administered 2014-07-26: 50 mg via INTRAVENOUS
  Administered 2014-07-26 (×2): 25 mg via INTRAVENOUS

## 2014-07-26 MED ORDER — MIDAZOLAM HCL 5 MG/5ML IJ SOLN
INTRAMUSCULAR | Status: AC
  Filled 2014-07-26: qty 10

## 2014-07-26 MED ORDER — SODIUM CHLORIDE 0.9 % IV SOLN
INTRAVENOUS | Status: DC
  Administered 2014-07-26: 07:00:00 via INTRAVENOUS

## 2014-07-26 MED ORDER — MIDAZOLAM HCL 5 MG/5ML IJ SOLN
INTRAMUSCULAR | Status: DC | PRN
  Administered 2014-07-26: 1 mg via INTRAVENOUS
  Administered 2014-07-26 (×2): 2 mg via INTRAVENOUS
  Administered 2014-07-26: 1 mg via INTRAVENOUS

## 2014-07-26 MED ORDER — ONDANSETRON HCL 4 MG/2ML IJ SOLN
INTRAMUSCULAR | Status: DC | PRN
  Administered 2014-07-26: 4 mg via INTRAVENOUS

## 2014-07-26 MED ORDER — MEPERIDINE HCL 100 MG/ML IJ SOLN
INTRAMUSCULAR | Status: AC
  Filled 2014-07-26: qty 2

## 2014-07-26 MED ORDER — ONDANSETRON HCL 4 MG/2ML IJ SOLN
INTRAMUSCULAR | Status: AC
  Filled 2014-07-26: qty 2

## 2014-07-26 NOTE — Discharge Instructions (Addendum)
°Colonoscopy °Discharge Instructions ° °Read the instructions outlined below and refer to this sheet in the next few weeks. These discharge instructions provide you with general information on caring for yourself after you leave the hospital. Your doctor may also give you specific instructions. While your treatment has been planned according to the most current medical practices available, unavoidable complications occasionally occur. If you have any problems or questions after discharge, call Dr. Rourk at [phone]. °ACTIVITY °· You may resume your regular activity, but move at a slower pace for the next 24 hours.  °· Take frequent rest periods for the next 24 hours.  °· Walking will help get rid of the air and reduce the bloated feeling in your belly (abdomen).  °· No driving for 24 hours (because of the medicine (anesthesia) used during the test).   °· Do not sign any important legal documents or operate any machinery for 24 hours (because of the anesthesia used during the test).  °NUTRITION °· Drink plenty of fluids.  °· You may resume your normal diet as instructed by your doctor.  °· Begin with a light meal and progress to your normal diet. Heavy or fried foods are harder to digest and may make you feel sick to your stomach (nauseated).  °· Avoid alcoholic beverages for 24 hours or as instructed.  °MEDICATIONS °· You may resume your normal medications unless your doctor tells you otherwise.  °WHAT YOU CAN EXPECT TODAY °· Some feelings of bloating in the abdomen.  °· Passage of more gas than usual.  °· Spotting of blood in your stool or on the toilet paper.  °IF YOU HAD POLYPS REMOVED DURING THE COLONOSCOPY: °· No aspirin products for 7 days or as instructed.  °· No alcohol for 7 days or as instructed.  °· Eat a soft diet for the next 24 hours.  °FINDING OUT THE RESULTS OF YOUR TEST °Not all test results are available during your visit. If your test results are not back during the visit, make an appointment  with your caregiver to find out the results. Do not assume everything is normal if you have not heard from your caregiver or the medical facility. It is important for you to follow up on all of your test results.  °SEEK IMMEDIATE MEDICAL ATTENTION IF: °· You have more than a spotting of blood in your stool.  °· Your belly is swollen (abdominal distention).  °· You are nauseated or vomiting.  °· You have a temperature over 101.  °· You have abdominal pain or discomfort that is severe or gets worse throughout the day.  ° °Diverticulosis and polyp information provided ° °Further recommendations to follow pending review of pathology report ° °Colon Polyps °Polyps are lumps of extra tissue growing inside the body. Polyps can grow in the large intestine (colon). Most colon polyps are noncancerous (benign). However, some colon polyps can become cancerous over time. Polyps that are larger than a pea may be harmful. To be safe, caregivers remove and test all polyps. °CAUSES  °Polyps form when mutations in the genes cause your cells to grow and divide even though no more tissue is needed. °RISK FACTORS °There are a number of risk factors that can increase your chances of getting colon polyps. They include: °· Being older than 50 years. °· Family history of colon polyps or colon cancer. °· Long-term colon diseases, such as colitis or Crohn disease. °· Being overweight. °· Smoking. °· Being inactive. °· Drinking too much alcohol. °  SYMPTOMS  °Most small polyps do not cause symptoms. If symptoms are present, they may include: °· Blood in the stool. The stool may look dark red or black. °· Constipation or diarrhea that lasts longer than 1 week. °DIAGNOSIS °People often do not know they have polyps until their caregiver finds them during a regular checkup. Your caregiver can use 4 tests to check for polyps: °· Digital rectal exam. The caregiver wears gloves and feels inside the rectum. This test would find polyps only in the  rectum. °· Barium enema. The caregiver puts a liquid called barium into your rectum before taking X-rays of your colon. Barium makes your colon look white. Polyps are dark, so they are easy to see in the X-ray pictures. °· Sigmoidoscopy. A thin, flexible tube (sigmoidoscope) is placed into your rectum. The sigmoidoscope has a light and tiny camera in it. The caregiver uses the sigmoidoscope to look at the last third of your colon. °· Colonoscopy. This test is like sigmoidoscopy, but the caregiver looks at the entire colon. This is the most common method for finding and removing polyps. °TREATMENT  °Any polyps will be removed during a sigmoidoscopy or colonoscopy. The polyps are then tested for cancer. °PREVENTION  °To help lower your risk of getting more colon polyps: °· Eat plenty of fruits and vegetables. Avoid eating fatty foods. °· Do not smoke. °· Avoid drinking alcohol. °· Exercise every day. °· Lose weight if recommended by your caregiver. °· Eat plenty of calcium and folate. Foods that are rich in calcium include milk, cheese, and broccoli. Foods that are rich in folate include chickpeas, kidney beans, and spinach. °HOME CARE INSTRUCTIONS °Keep all follow-up appointments as directed by your caregiver. You may need periodic exams to check for polyps. °SEEK MEDICAL CARE IF: °You notice bleeding during a bowel movement. °Document Released: 04/23/2004 Document Revised: 10/20/2011 Document Reviewed: 10/07/2011 °ExitCare® Patient Information ©2015 [employer]. This information is not intended to replace advice given to you by your health care provider. Make sure you discuss any questions you have with your health care provider. °Diverticulosis °Diverticulosis is the condition that develops when small pouches (diverticula) form in the wall of your colon. Your colon, or large intestine, is where water is absorbed and stool is formed. The pouches form when the inside layer of your colon pushes through weak spots in  the outer layers of your colon. °CAUSES  °No one knows exactly what causes diverticulosis. °RISK FACTORS °· Being older than 50. Your risk for this condition increases with age. Diverticulosis is rare in people younger than 40 years. By age 80, almost everyone has it. °· Eating a low-fiber diet. °· Being frequently constipated. °· Being overweight. °· Not getting enough exercise. °· Smoking. °· Taking over-the-counter pain medicines, like aspirin and ibuprofen. °SYMPTOMS  °Most people with diverticulosis do not have symptoms. °DIAGNOSIS  °Because diverticulosis often has no symptoms, health care providers often discover the condition during an exam for other colon problems. In many cases, a health care provider will diagnose diverticulosis while using a flexible scope to examine the colon (colonoscopy). °TREATMENT  °If you have never developed an infection related to diverticulosis, you may not need treatment. If you have had an infection before, treatment may include: °· Eating more fruits, vegetables, and grains. °· Taking a fiber supplement. °· Taking a live bacteria supplement (probiotic). °· Taking medicine to relax your colon. °HOME CARE INSTRUCTIONS  °· Drink at least 6-8 glasses of water each day to   prevent constipation. °· Try not to strain when you have a bowel movement. °· Keep all follow-up appointments. °If you have had an infection before:  °· Increase the fiber in your diet as directed by your health care provider or dietitian. °· Take a dietary fiber supplement if your health care provider approves. °· Only take medicines as directed by your health care provider. °SEEK MEDICAL CARE IF:  °· You have abdominal pain. °· You have bloating. °· You have cramps. °· You have not gone to the bathroom in 3 days. °SEEK IMMEDIATE MEDICAL CARE IF:  °· Your pain gets worse. °· Your bloating becomes very bad. °· You have a fever or chills, and your symptoms suddenly get worse. °· You begin vomiting. °· You have  bowel movements that are bloody or black. °MAKE SURE YOU: °· Understand these instructions. °· Will watch your condition. °· Will get help right away if you are not doing well or get worse. °Document Released: 04/24/2004 Document Revised: 08/02/2013 Document Reviewed: 06/22/2013 °ExitCare® Patient Information ©2015 [employer]. This information is not intended to replace advice given to you by your health care provider. Make sure you discuss any questions you have with your health care provider. ° °

## 2014-07-26 NOTE — H&P (Signed)
$'@LOGO'G$ @   Primary Care Physician:  Purvis Kilts, MD Primary Gastroenterologist:  Dr. Gala Romney  Pre-Procedure History & Physical: HPI:  Patrick Moore is a 53 y.o. male is here for a screening colonoscopy. No bowel symptoms. No family history of colon cancer. No prior colonoscopy.  Past Medical History  Diagnosis Date  . Plantar fasciitis   . Hypercholesteremia     Past Surgical History  Procedure Laterality Date  . Cardiac catheterization    . Inguinal lymph node biopsy      as a child    Prior to Admission medications   Medication Sig Start Date End Date Taking? Authorizing Provider  Fish Oil OIL Take 1 capsule by mouth daily.    Yes Historical Provider, MD  meloxicam (MOBIC) 15 MG tablet Take 15 mg by mouth daily.   Yes Historical Provider, MD  Multiple Vitamin (MULTIVITAMIN) capsule Take 1 capsule by mouth daily.   Yes Historical Provider, MD  peg 3350 powder (MOVIPREP) 100 G SOLR Take 1 kit (200 g total) by mouth as directed. 07/24/14  Yes Daneil Dolin, MD  pravastatin (PRAVACHOL) 20 MG tablet Take 20 mg by mouth daily.   Yes Historical Provider, MD    Allergies as of 06/06/2014  . (No Known Allergies)    History reviewed. No pertinent family history.  History   Social History  . Marital Status: Married    Spouse Name: N/A    Number of Children: N/A  . Years of Education: N/A   Occupational History  . Not on file.   Social History Main Topics  . Smoking status: Never Smoker   . Smokeless tobacco: Not on file  . Alcohol Use: Yes     Comment: beer three times weekly  . Drug Use: No  . Sexual Activity: Not on file   Other Topics Concern  . Not on file   Social History Narrative    Review of Systems: See HPI, otherwise negative ROS  Physical Exam: BP 157/102 mmHg  Pulse 77  Temp(Src) 98 F (36.7 C) (Oral)  Resp 18  Ht 6' (1.829 m)  Wt 205 lb (92.987 kg)  BMI 27.80 kg/m2  SpO2 99% General:   Alert,  Well-developed, well-nourished,  pleasant and cooperative in NAD Head:  Normocephalic and atraumatic. Eyes:  Sclera clear, no icterus.   Conjunctiva pink. Ears:  Normal auditory acuity. Nose:  No deformity, discharge,  or lesions. Mouth:  No deformity or lesions, dentition normal. Neck:  Supple; no masses or thyromegaly. Lungs:  Clear throughout to auscultation.   No wheezes, crackles, or rhonchi. No acute distress. Heart:  Regular rate and rhythm; no murmurs, clicks, rubs,  or gallops. Abdomen:  Soft, nontender and nondistended. No masses, hepatosplenomegaly or hernias noted. Normal bowel sounds, without guarding, and without rebound.   Msk:  Symmetrical without gross deformities. Normal posture. Pulses:  Normal pulses noted. Extremities:  Without clubbing or edema. Neurologic:  Alert and  oriented x4;  grossly normal neurologically. Skin:  Intact without significant lesions or rashes. Cervical Nodes:  No significant cervical adenopathy. Psych:  Alert and cooperative. Normal mood and affect.  Impression/Plan: [name] is now here to undergo a screening colonoscopy.  First ever average risk screening examination. Risks, benefits, limitations, imponderables and alternatives regarding colonoscopy have been reviewed with the patient. Questions have been answered. All parties agreeable.     Notice:  This dictation was prepared with Dragon dictation along with smaller phrase technology. Any transcriptional errors  that result from this process are unintentional and may not be corrected upon review.

## 2014-07-26 NOTE — Op Note (Signed)
[facility] [address] [place], [address]   COLONOSCOPY PROCEDURE REPORT  PATIENT: Patrick, Moore  MR#: [id number] BIRTHDATE: 1961-05-28 , 70  yrs. old GENDER: male ENDOSCOPIST: R.  Garfield Cornea, MD FACP [facility] REFERRED RS:WNIO Hilma Favors, M.D. PROCEDURE DATE:  08-07-2014 PROCEDURE:   Colonoscopy with biopsy INDICATIONS:First-ever average risk colorectal cancer screening examination. MEDICATIONS: Versed 6 mg IV and Demerol 100 mg IV in divided doses. Zofran 4 mg IV. ASA CLASS:       Class II  CONSENT: The risks, benefits, alternatives and imponderables including but not limited to bleeding, perforation as well as the possibility of a missed lesion have been reviewed.  The potential for biopsy, lesion removal, etc. have also been discussed. Questions have been answered.  All parties agreeable.  Please see the history and physical in the medical record for more information.  DESCRIPTION OF PROCEDURE:   After the risks benefits and alternatives of the procedure were thoroughly explained, informed consent was obtained.  The digital rectal exam      The EC-3890Li ([id number])  endoscope was introduced through the anus and advanced to the   . No adverse events experienced.   The quality of the prep was adequate.  The instrument was then slowly withdrawn as the colon was fully examined.      COLON FINDINGS: Normal rectum.  Few scattered sigmoid diverticula; (1) diminutive polyp in the base of the cecum; otherwise, the remainder of the colonic mucosa appeared normal.  The above-mentioned polyp was cold Biopsy/removed.  Retroflexion was performed. .  Withdrawal time=11 minutes 0 seconds.  The scope was withdrawn and the procedure completed. COMPLICATIONS: There were no immediate complications.  ENDOSCOPIC IMPRESSION: Single colonic polyp?"removed as described above. Sigmoid diverticulosis.  RECOMMENDATIONS: Follow up on pathology.  eSigned:  R. Garfield Cornea, MD FACP [facility] 08/07/2014 8:21 AM   cc:  CPT CODES: ICD CODES:  The ICD and CPT codes recommended by this software are interpretations from the data that the clinical staff has captured with the software.  The verification of the translation of this report to the ICD and CPT codes and modifiers is the sole responsibility of the health care institution and practicing physician where this report was generated.  [employer]. will not be held responsible for the validity of the ICD and CPT codes included on this report.  AMA assumes no liability for data contained or not contained herein. CPT is a Designer, television/film set of the [employer].  PATIENT NAME:  Patrick, Moore MR#: [id number]

## 2014-07-27 ENCOUNTER — Encounter: Payer: Self-pay | Admitting: Internal Medicine

## 2014-07-28 ENCOUNTER — Encounter (HOSPITAL_COMMUNITY): Payer: Self-pay | Admitting: Internal Medicine

## 2015-09-07 MED FILL — PRAVASTATIN NA 40 MG TAB: 40 | 30 days supply | Qty: 30 | Fill #2

## 2015-10-22 MED FILL — PRAVASTATIN NA 40 MG TAB: 40 | 30 days supply | Qty: 30 | Fill #3

## 2015-12-06 MED FILL — PRAVASTATIN NA 40 MG TAB: 40 | 30 days supply | Qty: 30 | Fill #4

## 2016-01-15 MED FILL — PRAVASTATIN NA 40 MG TAB: 40 | 30 days supply | Qty: 30 | Fill #5

## 2016-01-31 IMAGING — CR DG FEMUR 2+V*R*
4 series · 4 of 4 positions shown · non-contrast
Comparison: None.

CLINICAL DATA: Right mid femur pain posteriorly for 2 weeks without
history of injury

EXAM:
RIGHT FEMUR - 2 VIEW

[t femur proximal ap right]
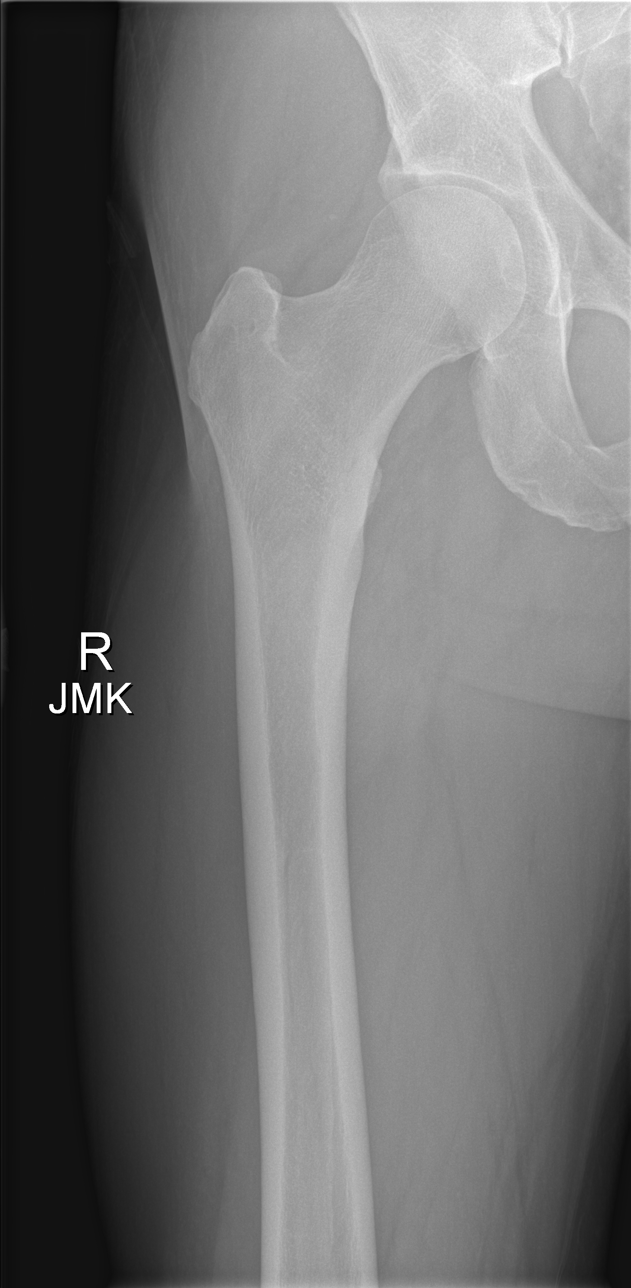

[t femur distal ap right]
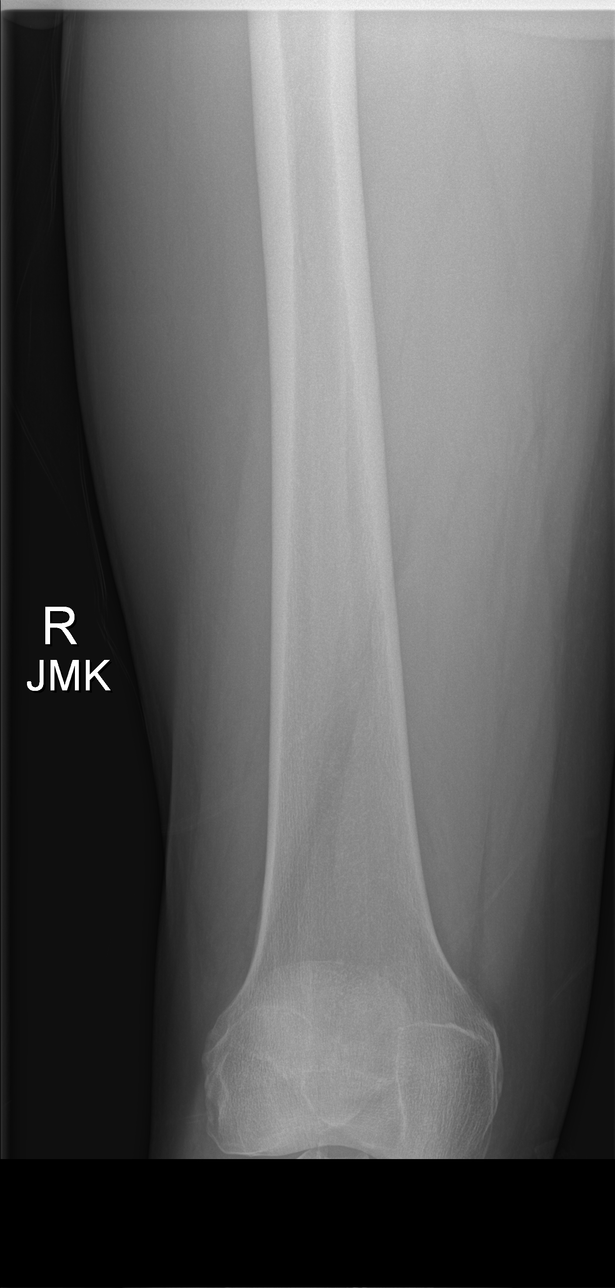

[t femur proximal lat right]
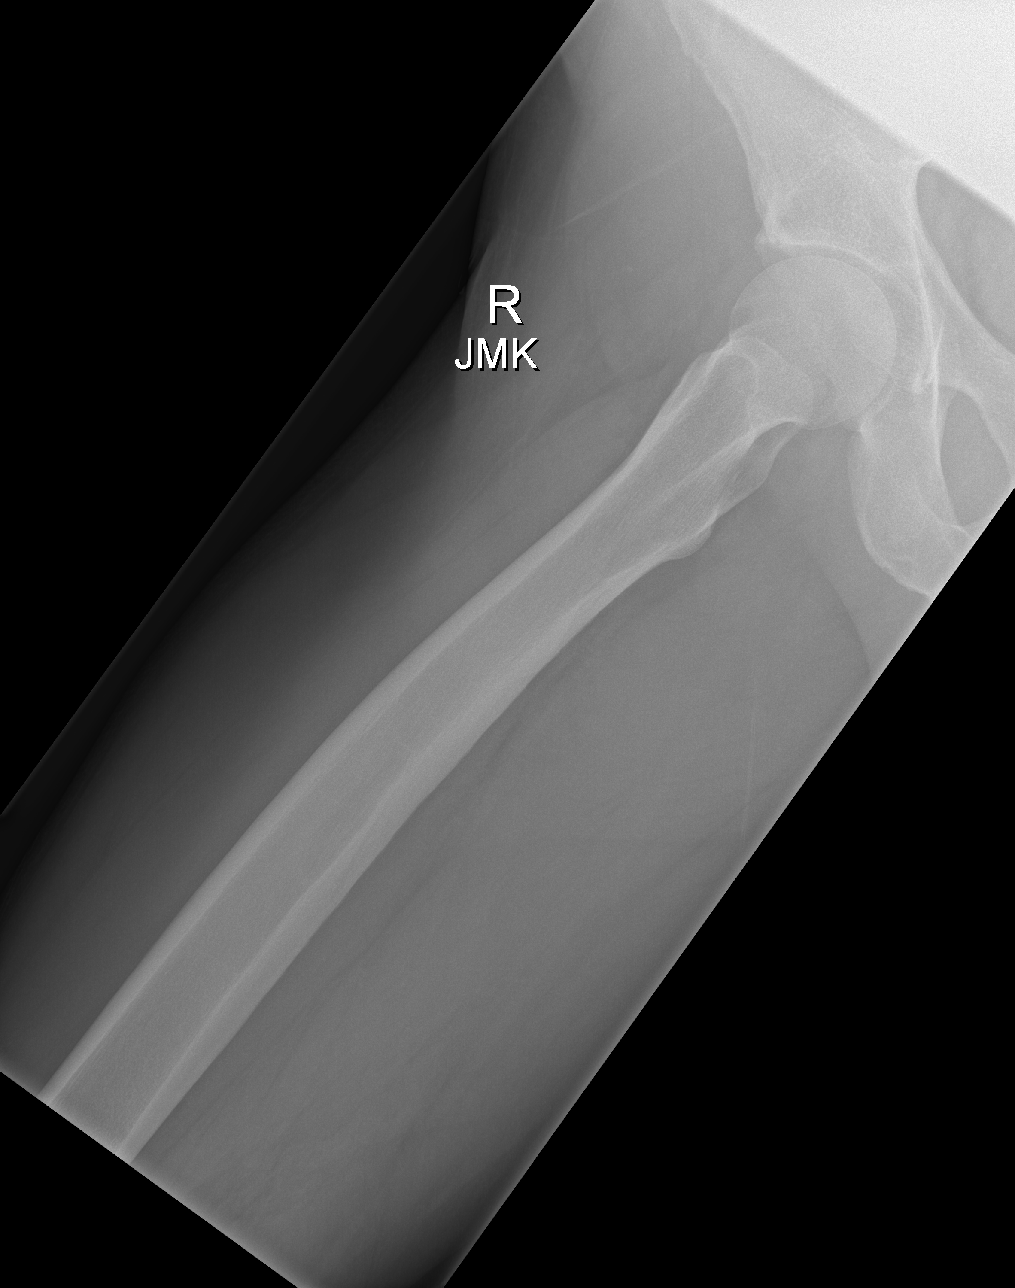

[t femur distal lat right]
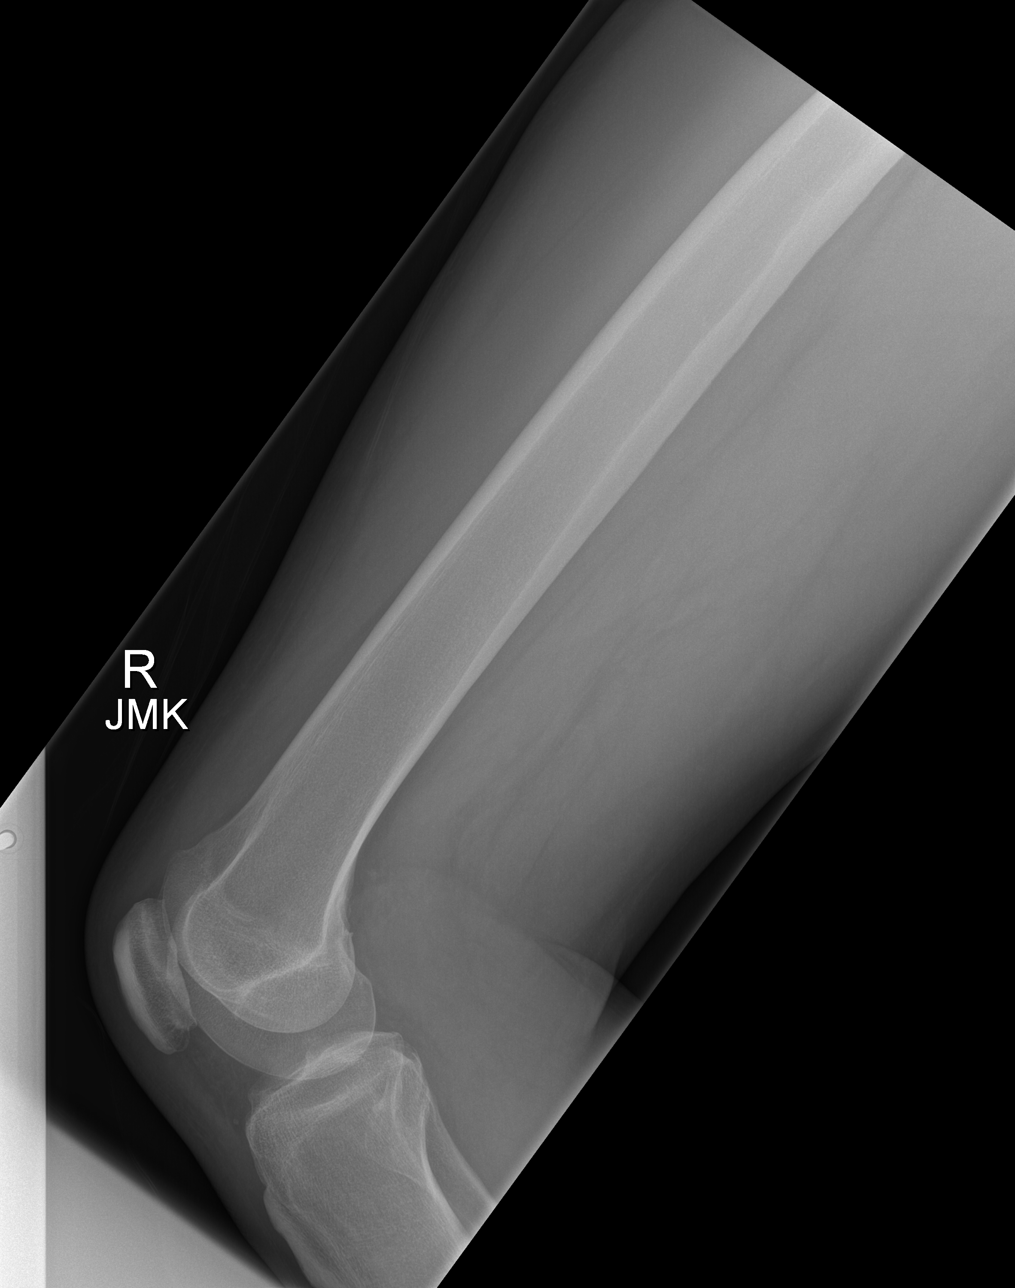

[4 of 4 positions shown; findings below may reference images not displayed]

FINDINGS: The right femur is adequately mineralized. There is no lytic or
blastic lesion nor periosteal reaction. The surrounding soft tissues
are normal. The observed portions of the right hip and of the right
knee are normal.
IMPRESSION: There is no acute bony abnormality of the right femur.

## 2016-02-11 MED FILL — PRAVASTATIN NA 40 MG TAB: 40 | 90 days supply | Qty: 90 | Fill #6

## 2016-04-08 DIAGNOSIS — E782 Mixed hyperlipidemia: Secondary | ICD-10-CM | POA: Diagnosis not present

## 2016-04-08 DIAGNOSIS — Z1389 Encounter for screening for other disorder: Secondary | ICD-10-CM | POA: Diagnosis not present

## 2016-04-08 DIAGNOSIS — I10 Essential (primary) hypertension: Secondary | ICD-10-CM | POA: Diagnosis not present

## 2016-04-08 DIAGNOSIS — Z23 Encounter for immunization: Secondary | ICD-10-CM | POA: Diagnosis not present

## 2016-04-08 DIAGNOSIS — Z6828 Body mass index (BMI) 28.0-28.9, adult: Secondary | ICD-10-CM | POA: Diagnosis not present

## 2016-04-08 DIAGNOSIS — E663 Overweight: Secondary | ICD-10-CM | POA: Diagnosis not present

## 2016-04-08 DIAGNOSIS — Z0001 Encounter for general adult medical examination with abnormal findings: Secondary | ICD-10-CM | POA: Diagnosis not present

## 2016-04-08 DIAGNOSIS — M5432 Sciatica, left side: Secondary | ICD-10-CM | POA: Diagnosis not present

## 2016-04-08 DIAGNOSIS — R7309 Other abnormal glucose: Secondary | ICD-10-CM | POA: Diagnosis not present

## 2016-04-08 MED FILL — LOSARTAN POTASSIUM 100 MG T: 100 | 90 days supply | Qty: 90 | Fill #0

## 2016-04-08 MED FILL — predniSONE 10 MG TABS: 10 | 15 days supply | Qty: 45 | Fill #0

## 2016-04-21 DIAGNOSIS — M9903 Segmental and somatic dysfunction of lumbar region: Secondary | ICD-10-CM | POA: Diagnosis not present

## 2016-04-21 DIAGNOSIS — M5442 Lumbago with sciatica, left side: Secondary | ICD-10-CM | POA: Diagnosis not present

## 2016-04-21 DIAGNOSIS — M9905 Segmental and somatic dysfunction of pelvic region: Secondary | ICD-10-CM | POA: Diagnosis not present

## 2016-04-21 DIAGNOSIS — M9902 Segmental and somatic dysfunction of thoracic region: Secondary | ICD-10-CM | POA: Diagnosis not present

## 2016-05-19 DIAGNOSIS — M5432 Sciatica, left side: Secondary | ICD-10-CM | POA: Diagnosis not present

## 2016-05-19 MED FILL — MELOXICAM 15 MG TABLET: 15 | 30 days supply | Qty: 30 | Fill #0

## 2016-05-19 MED FILL — HYDROCODON-APAP 5-325: 5-325 | 4 days supply | Qty: 15 | Fill #0

## 2016-05-19 MED FILL — METHYLPREDNISOLONE 4 MG TAB: 4 | 6 days supply | Qty: 21 | Fill #0

## 2016-05-26 DIAGNOSIS — M545 Low back pain: Secondary | ICD-10-CM | POA: Diagnosis not present

## 2016-06-03 DIAGNOSIS — M545 Low back pain: Secondary | ICD-10-CM | POA: Diagnosis not present

## 2016-06-03 DIAGNOSIS — M5116 Intervertebral disc disorders with radiculopathy, lumbar region: Secondary | ICD-10-CM | POA: Diagnosis not present

## 2016-06-05 DIAGNOSIS — M5116 Intervertebral disc disorders with radiculopathy, lumbar region: Secondary | ICD-10-CM | POA: Diagnosis not present

## 2016-06-05 DIAGNOSIS — M545 Low back pain: Secondary | ICD-10-CM | POA: Diagnosis not present

## 2016-06-09 MED FILL — HYDROCODON-APAP 5-325: 5-325 | 13 days supply | Qty: 40 | Fill #0

## 2016-06-12 MED FILL — PRAVASTATIN NA 40 MG TAB: 40 | 90 days supply | Qty: 90 | Fill #0

## 2016-06-18 DIAGNOSIS — M545 Low back pain: Secondary | ICD-10-CM | POA: Diagnosis not present

## 2016-06-18 DIAGNOSIS — M5116 Intervertebral disc disorders with radiculopathy, lumbar region: Secondary | ICD-10-CM | POA: Diagnosis not present

## 2016-06-18 DIAGNOSIS — M5432 Sciatica, left side: Secondary | ICD-10-CM | POA: Diagnosis not present

## 2016-06-18 MED FILL — LYRICA 75 MG CAPSULE: 75 | 7 days supply | Qty: 14 | Fill #0

## 2016-07-07 DIAGNOSIS — M5416 Radiculopathy, lumbar region: Secondary | ICD-10-CM | POA: Diagnosis not present

## 2016-08-05 MED FILL — LOSARTAN POTASSIUM 100 MG T: 100 | 90 days supply | Qty: 90 | Fill #1

## 2016-10-20 MED FILL — PRAVASTATIN NA 40 MG TAB: 40 | 90 days supply | Qty: 90 | Fill #1

## 2016-12-31 MED FILL — LOSARTAN POTASSIUM 100 MG T: 100 | 90 days supply | Qty: 90 | Fill #2

## 2017-02-12 MED FILL — PRAVASTATIN NA 40 MG TAB: 40 | 90 days supply | Qty: 90 | Fill #0

## 2017-04-23 DIAGNOSIS — R7309 Other abnormal glucose: Secondary | ICD-10-CM | POA: Diagnosis not present

## 2017-04-23 DIAGNOSIS — Z0000 Encounter for general adult medical examination without abnormal findings: Secondary | ICD-10-CM | POA: Diagnosis not present

## 2017-04-23 DIAGNOSIS — H6123 Impacted cerumen, bilateral: Secondary | ICD-10-CM | POA: Diagnosis not present

## 2017-04-23 DIAGNOSIS — E782 Mixed hyperlipidemia: Secondary | ICD-10-CM | POA: Diagnosis not present

## 2017-04-23 DIAGNOSIS — I10 Essential (primary) hypertension: Secondary | ICD-10-CM | POA: Diagnosis not present

## 2017-04-23 DIAGNOSIS — Z1389 Encounter for screening for other disorder: Secondary | ICD-10-CM | POA: Diagnosis not present

## 2017-04-23 DIAGNOSIS — Z6828 Body mass index (BMI) 28.0-28.9, adult: Secondary | ICD-10-CM | POA: Diagnosis not present

## 2017-04-23 DIAGNOSIS — Z23 Encounter for immunization: Secondary | ICD-10-CM | POA: Diagnosis not present

## 2017-04-23 DIAGNOSIS — M1990 Unspecified osteoarthritis, unspecified site: Secondary | ICD-10-CM | POA: Diagnosis not present

## 2017-04-24 MED FILL — ROSUVASTATIN CALCIUM 40 MG: 40 | 90 days supply | Qty: 90 | Fill #0

## 2017-05-14 MED FILL — LOSARTAN POTASSIUM 100 MG T: 100 | 90 days supply | Qty: 90 | Fill #0

## 2017-08-18 MED FILL — LOSARTAN POTASSIUM 100 MG T: 100 | 90 days supply | Qty: 90 | Fill #1

## 2017-10-13 MED FILL — ROSUVASTATIN CALCIUM 40 MG: 40 | 90 days supply | Qty: 90 | Fill #1

## 2017-11-20 MED FILL — LOSARTAN POTASSIUM 100 MG T: 100 | 90 days supply | Qty: 90 | Fill #2

## 2018-02-08 MED FILL — ROSUVASTATIN CALCIUM 40 MG: 40 | 90 days supply | Qty: 90 | Fill #2

## 2018-03-16 DIAGNOSIS — W57XXXA Bitten or stung by nonvenomous insect and other nonvenomous arthropods, initial encounter: Secondary | ICD-10-CM | POA: Diagnosis not present

## 2018-03-16 DIAGNOSIS — Z1389 Encounter for screening for other disorder: Secondary | ICD-10-CM | POA: Diagnosis not present

## 2018-03-16 DIAGNOSIS — Z6829 Body mass index (BMI) 29.0-29.9, adult: Secondary | ICD-10-CM | POA: Diagnosis not present

## 2018-03-16 DIAGNOSIS — L299 Pruritus, unspecified: Secondary | ICD-10-CM | POA: Diagnosis not present

## 2018-03-16 DIAGNOSIS — E663 Overweight: Secondary | ICD-10-CM | POA: Diagnosis not present

## 2018-03-16 MED FILL — TRIAMCINOLONE 0.1% CREAM: 0.1 | 7 days supply | Qty: 30 | Fill #0

## 2018-03-16 MED FILL — LOSARTAN POTASSIUM 100 MG T: 100 | 90 days supply | Qty: 90 | Fill #0

## 2018-04-27 DIAGNOSIS — Z23 Encounter for immunization: Secondary | ICD-10-CM | POA: Diagnosis not present

## 2018-04-27 DIAGNOSIS — M1990 Unspecified osteoarthritis, unspecified site: Secondary | ICD-10-CM | POA: Diagnosis not present

## 2018-04-27 DIAGNOSIS — Z1389 Encounter for screening for other disorder: Secondary | ICD-10-CM | POA: Diagnosis not present

## 2018-04-27 DIAGNOSIS — Z0001 Encounter for general adult medical examination with abnormal findings: Secondary | ICD-10-CM | POA: Diagnosis not present

## 2018-04-27 DIAGNOSIS — R7309 Other abnormal glucose: Secondary | ICD-10-CM | POA: Diagnosis not present

## 2018-04-27 DIAGNOSIS — I10 Essential (primary) hypertension: Secondary | ICD-10-CM | POA: Diagnosis not present

## 2018-04-27 DIAGNOSIS — R109 Unspecified abdominal pain: Secondary | ICD-10-CM | POA: Diagnosis not present

## 2018-04-27 DIAGNOSIS — R001 Bradycardia, unspecified: Secondary | ICD-10-CM | POA: Diagnosis not present

## 2018-04-27 DIAGNOSIS — Z6828 Body mass index (BMI) 28.0-28.9, adult: Secondary | ICD-10-CM | POA: Diagnosis not present

## 2018-04-27 DIAGNOSIS — E782 Mixed hyperlipidemia: Secondary | ICD-10-CM | POA: Diagnosis not present

## 2018-04-28 MED FILL — ROSUVASTATIN CALCIUM 40 MG: 40 | 90 days supply | Qty: 90 | Fill #0

## 2018-06-21 MED FILL — LOSARTAN POTASSIUM 100 MG T: 100 | 90 days supply | Qty: 90 | Fill #0

## 2018-08-18 MED FILL — ROSUVASTATIN CALCIUM 40 MG: 40 | 90 days supply | Qty: 90 | Fill #1

## 2018-08-31 ENCOUNTER — Telehealth: Payer: Self-pay | Admitting: Internal Medicine

## 2018-08-31 NOTE — Telephone Encounter (Signed)
Dr.Rourk- is it ok to do this pts tcs early?  Also, he was given versed 6mg , demerol 100mg  and zofran 4mg  with this last tcs, does he need propofol this time?

## 2018-08-31 NOTE — Telephone Encounter (Signed)
[place] called on patient's behave saying that the patient isn't due his next colonoscopy with [place] until 07/2021 (7 yrs from his last colonoscopy). Patient is wanting a colonoscopy done ASAP due to strong family history of colon cancer and history of polyps. I told the girl from [place] to send [place] a referral via proficient and document what she was telling me and I would ask the triage nurse what she would recommend as far as scheduling the patient.

## 2018-09-03 ENCOUNTER — Encounter: Payer: Self-pay | Admitting: Internal Medicine

## 2018-09-03 NOTE — Telephone Encounter (Signed)
noted 

## 2018-09-03 NOTE — Telephone Encounter (Signed)
OV made for 3/18 at 0830 with EG and letter mailed

## 2018-09-03 NOTE — Telephone Encounter (Signed)
OK to go ahead and schedule - propofol

## 2018-09-21 MED FILL — LOSARTAN POTASSIUM 100 MG T: 100 | 90 days supply | Qty: 90 | Fill #1

## 2018-10-27 ENCOUNTER — Other Ambulatory Visit: Payer: Self-pay

## 2018-10-27 ENCOUNTER — Ambulatory Visit (INDEPENDENT_AMBULATORY_CARE_PROVIDER_SITE_OTHER): Payer: 59 | Admitting: Nurse Practitioner

## 2018-10-27 ENCOUNTER — Encounter: Payer: Self-pay | Admitting: Nurse Practitioner

## 2018-10-27 DIAGNOSIS — Z800 Family history of malignant neoplasm of digestive organs: Secondary | ICD-10-CM | POA: Diagnosis not present

## 2018-10-27 MED ORDER — CLENPIQ 10-3.5-12 MG-GM -GM/160ML PO SOLN: 1.0000 | Freq: Once | ORAL | 0 refills | Status: AC

## 2018-10-27 MED FILL — CLENPIQ 10-3.5-12 MG-GM -GM: 10-3.5-12 M | 1 days supply | Qty: 320 | Fill #0

## 2018-10-27 NOTE — Assessment & Plan Note (Signed)
Noted family history of colon cancer in his sister who was diagnosed after his most recent colonoscopy.  She had never had a colonoscopy before, was diagnosed and passed away 4 months later.  Last colonoscopy was 5 years ago.  New revelation of family history alterations recommended screening interval now that he is high risk.  We will proceed with scheduling a surveillance/screening colonoscopy.  Further recommendations to follow.  Follow-up based on recommendations made after his colonoscopy.  Proceed with TCS with Dr. Gala Romney in near future: the risks, benefits, and alternatives have been discussed with the patient in detail. The patient states understanding and desires to proceed.  The patient is not on any anticoagulants, anxiolytics, chronic pain medications, or antidepressants.  Conscious sedation should be adequate for his procedure.

## 2018-10-27 NOTE — Progress Notes (Signed)
Primary Care Physician:  Sharilyn Sites, MD Primary Gastroenterologist:  Dr. Gala Romney  Chief Complaint  Patient presents with  . Consult    Sister had colon cancer. Has personal HX colon polyps. Last had TCS 6 yrs ago    HPI:   Patrick Moore is a 58 y.o. male who presents on referral from primary care for colonoscopy.  Nurse/phone triage was deferred office visit due to likely need for augmented sedation.  Previous colonoscopy completed 07/26/2014 which found a single colon polyp, sigmoid diverticulosis, otherwise normal.  Surgical pathology found the polyp to be tubular adenoma.  Recommended repeat colonoscopy in 7 years (2022).  Today he states he's doing ok overall. Since his last colonoscopy his sister was diagnosed with CRC age 100 and passed away 4 months later. Denies abdominal pain, N/V, hematochezia, melena, fever, chills, unintentional weight loss. Denies chest pain, dyspnea, dizziness, lightheadedness, syncope, near syncope. Denies any other upper or lower GI symptoms.  Past Medical History:  Diagnosis Date  . Hypercholesteremia   . Lumbar disc herniation   . Plantar fasciitis     Past Surgical History:  Procedure Laterality Date  . CARDIAC CATHETERIZATION    . COLONOSCOPY N/A 07/26/2014   Procedure: COLONOSCOPY;  Surgeon: Daneil Dolin, MD;  Location: [facility];  Service: Endoscopy;  Laterality: N/A;  7:30 AM  . INGUINAL LYMPH NODE BIOPSY     as a child    Current Outpatient Medications  Medication Sig Dispense Refill  . Desonide POWD by Does not apply route.    . Fish Oil OIL Take 1 capsule by mouth daily.     Marland Kitchen losartan (COZAAR) 100 MG tablet daily.    . Multiple Vitamin (MULTIVITAMIN) capsule Take 1 capsule by mouth daily.    . rosuvastatin (CRESTOR) 40 MG tablet daily.    . TURMERIC PO Take by mouth daily.     No current facility-administered medications for this visit.     Allergies as of 10/27/2018  . (No Known Allergies)    Family History   Problem Relation Age of Onset  . Colon cancer Sister 30       passed 4 months after diagnosis    Social History   Socioeconomic History  . Marital status: Married    Spouse name: Not on file  . Number of children: Not on file  . Years of education: Not on file  . Highest education level: Not on file  Occupational History  . Not on file  Social Needs  . Financial resource strain: Not on file  . Food insecurity:    Worry: Not on file    Inability: Not on file  . Transportation needs:    Medical: Not on file    Non-medical: Not on file  Tobacco Use  . Smoking status: Never Smoker  . Smokeless tobacco: Never Used  Substance and Sexual Activity  . Alcohol use: Yes    Comment: beer three times weekly  . Drug use: No  . Sexual activity: Not on file  Lifestyle  . Physical activity:    Days per week: Not on file    Minutes per session: Not on file  . Stress: Not on file  Relationships  . Social connections:    Talks on phone: Not on file    Gets together: Not on file    Attends religious service: Not on file    Active member of club or organization: Not on file  Attends meetings of clubs or organizations: Not on file    Relationship status: Not on file  . Intimate partner violence:    Fear of current or ex partner: Not on file    Emotionally abused: Not on file    Physically abused: Not on file    Forced sexual activity: Not on file  Other Topics Concern  . Not on file  Social History Narrative  . Not on file    Review of Systems: General: Negative for anorexia, weight loss, fever, chills, fatigue, weakness. ENT: Negative for hoarseness, difficulty swallowing. CV: Negative for chest pain, angina, palpitations, peripheral edema.  Respiratory: Negative for dyspnea at rest, cough, sputum, wheezing.  GI: See history of present illness. MS: Negative for joint pain, low back pain.  Derm: Negative for rash or itching.  Endo: Negative for unusual weight change.   Heme: Negative for bruising or bleeding. Allergy: Negative for rash or hives.    Physical Exam: BP (!) 150/92   Pulse 86   Temp 98.3 F (36.8 C) (Oral)   Ht 6' (1.829 m)   Wt 216 lb 9.6 oz (98.2 kg)   BMI 29.38 kg/m  General:   Alert and oriented. Pleasant and cooperative. Well-nourished and well-developed.  Head:  Normocephalic and atraumatic. Eyes:  Without icterus, sclera clear and conjunctiva pink.  Ears:  Normal auditory acuity. Cardiovascular:  S1, S2 present without murmurs appreciated. Extremities without clubbing or edema. Respiratory:  Clear to auscultation bilaterally. No wheezes, rales, or rhonchi. No distress.  Gastrointestinal:  +BS, soft, non-tender and non-distended. No HSM noted. No guarding or rebound. No masses appreciated.  Rectal:  Deferred  Musculoskalatal:  Symmetrical without gross deformities. Neurologic:  Alert and oriented x4;  grossly normal neurologically. Psych:  Alert and cooperative. Normal mood and affect. Heme/Lymph/Immune: No excessive bruising noted.    10/27/2018 8:52 AM   Disclaimer: This note was dictated with voice recognition software. Similar sounding words can inadvertently be transcribed and may not be corrected upon review.

## 2018-10-27 NOTE — Patient Instructions (Signed)
Your health issues we discussed today were:   Need for colonoscopy: 1. We will schedule your colonoscopy for you 2. We appreciate your patience in waiting for your colonoscopy giving the pandemic currently going on 3. Further recommendations will follow your colonoscopy  Overall I recommend:  1. Return for follow-up based on recommendations made after colonoscopy 2. Call us if you have any questions or concerns.    Because of recent events of COVID-19 ("Coronavirus"), follow CDC recommendations:  1. Wash your hand frequently 2. Avoid touching your face 3. Stay away from people who are sick 4. If you have symptoms such as fever, cough, shortness of breath then call your healthcare provider for further guidance 5. If you are sick, STAY AT HOME unless otherwise directed by your healthcare provider. 6. Follow directions from state and national officials regarding staying safe    At [facility] we value your feedback. You may receive a survey about your visit today. Please share your experience as we strive to create trusting relationships with our patients to provide genuine, compassionate, quality care.  We appreciate your understanding and patience as we review any laboratory studies, imaging, and other diagnostic tests that are ordered as we care for you. Our office policy is 5 business days for review of these results, and any emergent or urgent results are addressed in a timely manner for your best interest. If you do not hear from our office in 1 week, please contact us.   We also encourage the use of MyChart, which contains your medical information for your review as well. If you are not enrolled in this feature, an access code is on this after visit summary for your convenience. Thank you for allowing [place] to be involved in your care.  It was great to see you today!  I hope you have a great day!!

## 2018-10-27 NOTE — Progress Notes (Signed)
cc'ed to pcp °

## 2018-10-28 ENCOUNTER — Telehealth: Payer: Self-pay

## 2018-10-28 NOTE — Telephone Encounter (Signed)
PA for TCS submitted via [facility] website. 

## 2018-11-11 NOTE — Telephone Encounter (Signed)
Called [facility] to follow-up on PA for TCS. No PA needed.

## 2018-11-30 MED FILL — ROSUVASTATIN CALCIUM 40 MG: 40 | 90 days supply | Qty: 90 | Fill #0

## 2018-12-16 MED FILL — LOSARTAN POTASSIUM 100 MG T: 100 | 90 days supply | Qty: 90 | Fill #0

## 2018-12-29 ENCOUNTER — Telehealth: Payer: Self-pay | Admitting: Internal Medicine

## 2018-12-29 NOTE — Telephone Encounter (Signed)
Patient aware still on for procedure next week

## 2018-12-29 NOTE — Telephone Encounter (Signed)
[phone]  PATIENT CALLED INQUIRING IF HIS PROCEDURE WAS STILL ON FOR NEXT WEEK

## 2018-12-30 ENCOUNTER — Other Ambulatory Visit: Payer: Self-pay

## 2018-12-30 ENCOUNTER — Other Ambulatory Visit (HOSPITAL_COMMUNITY)
Admission: RE | Admit: 2018-12-30 | Discharge: 2018-12-30 | Disposition: A | Discharge status: Home / Self Care | Payer: 59 | Source: Ambulatory Visit | Attending: Internal Medicine | Admitting: Internal Medicine

## 2018-12-30 DIAGNOSIS — Z1159 Encounter for screening for other viral diseases: Secondary | ICD-10-CM | POA: Insufficient documentation

## 2018-12-31 LAB — NOVEL CORONAVIRUS, NAA (HOSP ORDER, SEND-OUT TO REF LAB; TAT 18-24 HRS): SARS-CoV-2, NAA: NOT DETECTED

## 2019-01-05 ENCOUNTER — Ambulatory Visit (HOSPITAL_COMMUNITY)
Admission: RE | Admit: 2019-01-05 | Discharge: 2019-01-05 | Disposition: A | Discharge status: Home / Self Care | Payer: 59 | Attending: Internal Medicine | Admitting: Internal Medicine

## 2019-01-05 ENCOUNTER — Encounter (HOSPITAL_COMMUNITY): Payer: Self-pay | Admitting: *Deleted

## 2019-01-05 ENCOUNTER — Other Ambulatory Visit: Payer: Self-pay

## 2019-01-05 ENCOUNTER — Encounter (HOSPITAL_COMMUNITY)
Admission: RE | Disposition: A | Discharge status: Home / Self Care | Payer: Self-pay | Source: Home / Self Care | Attending: Internal Medicine

## 2019-01-05 DIAGNOSIS — K640 First degree hemorrhoids: Secondary | ICD-10-CM | POA: Insufficient documentation

## 2019-01-05 DIAGNOSIS — Z86010 Personal history of colonic polyps: Secondary | ICD-10-CM | POA: Insufficient documentation

## 2019-01-05 DIAGNOSIS — K635 Polyp of colon: Secondary | ICD-10-CM

## 2019-01-05 DIAGNOSIS — K5730 Diverticulosis of large intestine without perforation or abscess without bleeding: Secondary | ICD-10-CM | POA: Insufficient documentation

## 2019-01-05 DIAGNOSIS — D122 Benign neoplasm of ascending colon: Secondary | ICD-10-CM | POA: Diagnosis not present

## 2019-01-05 DIAGNOSIS — Z8371 Family history of colonic polyps: Secondary | ICD-10-CM | POA: Insufficient documentation

## 2019-01-05 DIAGNOSIS — E7800 Pure hypercholesterolemia, unspecified: Secondary | ICD-10-CM | POA: Insufficient documentation

## 2019-01-05 DIAGNOSIS — Z79899 Other long term (current) drug therapy: Secondary | ICD-10-CM | POA: Diagnosis not present

## 2019-01-05 DIAGNOSIS — I10 Essential (primary) hypertension: Secondary | ICD-10-CM | POA: Insufficient documentation

## 2019-01-05 DIAGNOSIS — Z1211 Encounter for screening for malignant neoplasm of colon: Secondary | ICD-10-CM | POA: Diagnosis not present

## 2019-01-05 DIAGNOSIS — Z800 Family history of malignant neoplasm of digestive organs: Secondary | ICD-10-CM | POA: Diagnosis not present

## 2019-01-05 HISTORY — PX: COLONOSCOPY: SHX5424

## 2019-01-05 HISTORY — PX: POLYPECTOMY: SHX5525

## 2019-01-05 HISTORY — DX: Essential (primary) hypertension: I10

## 2019-01-05 SURGERY — COLONOSCOPY
Anesthesia: Moderate Sedation

## 2019-01-05 MED ORDER — ONDANSETRON HCL 4 MG/2ML IJ SOLN
INTRAMUSCULAR | Status: AC
  Filled 2019-01-05: qty 2

## 2019-01-05 MED ORDER — MIDAZOLAM HCL 5 MG/5ML IJ SOLN
INTRAMUSCULAR | Status: DC | PRN
  Administered 2019-01-05: 2 mg via INTRAVENOUS
  Administered 2019-01-05 (×2): 1 mg via INTRAVENOUS
  Administered 2019-01-05 (×2): 2 mg via INTRAVENOUS

## 2019-01-05 MED ORDER — MEPERIDINE HCL 50 MG/ML IJ SOLN
INTRAMUSCULAR | Status: AC
  Filled 2019-01-05: qty 1

## 2019-01-05 MED ORDER — STERILE WATER FOR IRRIGATION IR SOLN
Status: DC | PRN
  Administered 2019-01-05: 1.5 mL

## 2019-01-05 MED ORDER — SODIUM CHLORIDE 0.9 % IV SOLN
INTRAVENOUS | Status: DC
  Administered 2019-01-05: 10:00:00 via INTRAVENOUS

## 2019-01-05 MED ORDER — MIDAZOLAM HCL 5 MG/5ML IJ SOLN
INTRAMUSCULAR | Status: AC
  Filled 2019-01-05: qty 10

## 2019-01-05 MED ORDER — MEPERIDINE HCL 100 MG/ML IJ SOLN
INTRAMUSCULAR | Status: DC | PRN
  Administered 2019-01-05 (×2): 25 mg via INTRAVENOUS

## 2019-01-05 MED ORDER — ONDANSETRON HCL 4 MG/2ML IJ SOLN
INTRAMUSCULAR | Status: DC | PRN
  Administered 2019-01-05: 4 mg via INTRAVENOUS

## 2019-01-05 NOTE — Discharge Instructions (Signed)
Colonoscopy Discharge Instructions  Read the instructions outlined below and refer to this sheet in the next few weeks. These discharge instructions provide you with general information on caring for yourself after you leave the hospital. Your doctor may also give you specific instructions. While your treatment has been planned according to the most current medical practices available, unavoidable complications occasionally occur. If you have any problems or questions after discharge, call Dr. Gala Romney at [phone]. ACTIVITY  You may resume your regular activity, but move at a slower pace for the next 24 hours.   Take frequent rest periods for the next 24 hours.   Walking will help get rid of the air and reduce the bloated feeling in your belly (abdomen).   No driving for 24 hours (because of the medicine (anesthesia) used during the test).    Do not sign any important legal documents or operate any machinery for 24 hours (because of the anesthesia used during the test).  NUTRITION  Drink plenty of fluids.   You may resume your normal diet as instructed by your doctor.   Begin with a light meal and progress to your normal diet. Heavy or fried foods are harder to digest and may make you feel sick to your stomach (nauseated).   Avoid alcoholic beverages for 24 hours or as instructed.  MEDICATIONS  You may resume your normal medications unless your doctor tells you otherwise.  WHAT YOU CAN EXPECT TODAY  Some feelings of bloating in the abdomen.   Passage of more gas than usual.   Spotting of blood in your stool or on the toilet paper.  IF YOU HAD POLYPS REMOVED DURING THE COLONOSCOPY:  No aspirin products for 7 days or as instructed.   No alcohol for 7 days or as instructed.   Eat a soft diet for the next 24 hours.  FINDING OUT THE RESULTS OF YOUR TEST Not all test results are available during your visit. If your test results are not back during the visit, make an appointment  with your caregiver to find out the results. Do not assume everything is normal if you have not heard from your caregiver or the medical facility. It is important for you to follow up on all of your test results.  SEEK IMMEDIATE MEDICAL ATTENTION IF:  You have more than a spotting of blood in your stool.   Your belly is swollen (abdominal distention).   You are nauseated or vomiting.   You have a temperature over 101.   You have abdominal pain or discomfort that is severe or gets worse throughout the day.    Colon polyp and diverticulosis information provided  Further recommendations to follow pending review of pathology report  At patient's request, I called and left a message with Jevaun Strick at [phone]   Diverticulosis  Diverticulosis is a condition that develops when small pouches (diverticula) form in the wall of the large intestine (colon). The colon is where water is absorbed and stool is formed. The pouches form when the inside layer of the colon pushes through weak spots in the outer layers of the colon. You may have a few pouches or many of them. What are the causes? The cause of this condition is not known. What increases the risk? The following factors may make you more likely to develop this condition:  Being older than age 27. Your risk for this condition increases with age. Diverticulosis is rare among people younger than age 42. By age 78,  many people have it.  Eating a low-fiber diet.  Having frequent constipation.  Being overweight.  Not getting enough exercise.  Smoking.  Taking over-the-counter pain medicines, like aspirin and ibuprofen.  Having a family history of diverticulosis. What are the signs or symptoms? In most people, there are no symptoms of this condition. If you do have symptoms, they may include:  Bloating.  Cramps in the abdomen.  Constipation or diarrhea.  Pain in the lower left side of the abdomen. How is this  diagnosed? This condition is most often diagnosed during an exam for other colon problems. Because diverticulosis usually has no symptoms, it often cannot be diagnosed independently. This condition may be diagnosed by:  Using a flexible scope to examine the colon (colonoscopy).  Taking an X-ray of the colon after dye has been put into the colon (barium enema).  Doing a CT scan. How is this treated? You may not need treatment for this condition if you have never developed an infection related to diverticulosis. If you have had an infection before, treatment may include:  Eating a high-fiber diet. This may include eating more fruits, vegetables, and grains.  Taking a fiber supplement.  Taking a live bacteria supplement (probiotic).  Taking medicine to relax your colon.  Taking antibiotic medicines. Follow these instructions at home:  Drink 6-8 glasses of water or more each day to prevent constipation.  Try not to strain when you have a bowel movement.  If you have had an infection before: ? Eat more fiber as directed by your health care provider or your diet and nutrition specialist (dietitian). ? Take a fiber supplement or probiotic, if your health care provider approves.  Take over-the-counter and prescription medicines only as told by your health care provider.  If you were prescribed an antibiotic, take it as told by your health care provider. Do not stop taking the antibiotic even if you start to feel better.  Keep all follow-up visits as told by your health care provider. This is important. Contact a health care provider if:  You have pain in your abdomen.  You have bloating.  You have cramps.  You have not had a bowel movement in 3 days. Get help right away if:  Your pain gets worse.  Your bloating becomes very bad.  You have a fever or chills, and your symptoms suddenly get worse.  You vomit.  You have bowel movements that are bloody or black.  You have  bleeding from your rectum. Summary  Diverticulosis is a condition that develops when small pouches (diverticula) form in the wall of the large intestine (colon).  You may have a few pouches or many of them.  This condition is most often diagnosed during an exam for other colon problems.  If you have had an infection related to diverticulosis, treatment may include increasing the fiber in your diet, taking supplements, or taking medicines. This information is not intended to replace advice given to you by your health care provider. Make sure you discuss any questions you have with your health care provider. Document Released: 04/24/2004 Document Revised: 06/16/2016 Document Reviewed: 06/16/2016 Elsevier Interactive Patient Education  2019 [employer]  High-Fiber Diet Fiber, also called dietary fiber, is a type of carbohydrate that is found in fruits, vegetables, whole grains, and beans. A high-fiber diet can have many health benefits. Your health care provider may recommend a high-fiber diet to help:  Prevent constipation. Fiber can make your bowel movements more regular.  Lower  your cholesterol.  Relieve the following conditions: ? Swelling of veins in the anus (hemorrhoids). ? Swelling and irritation (inflammation) of specific areas of the digestive tract (uncomplicated diverticulosis). ? A problem of the large intestine (colon) that sometimes causes pain and diarrhea (irritable bowel syndrome, IBS).  Prevent overeating as part of a weight-loss plan.  Prevent heart disease, type 2 diabetes, and certain cancers. What is my plan? The recommended daily fiber intake in grams (g) includes:  38 g for men age 66 or younger.  30 g for men over age 73.  35 g for women age 59 or younger.  21 g for women over age 40. You can get the recommended daily intake of dietary fiber by:  Eating a variety of fruits, vegetables, grains, and beans.  Taking a fiber supplement, if it is not  possible to get enough fiber through your diet. What do I need to know about a high-fiber diet?  It is better to get fiber through food sources rather than from fiber supplements. There is not a lot of research about how effective supplements are.  Always check the fiber content on the nutrition facts label of any prepackaged food. Look for foods that contain 5 g of fiber or more per serving.  Talk with a diet and nutrition specialist (dietitian) if you have questions about specific foods that are recommended or not recommended for your medical condition, especially if those foods are not listed below.  Gradually increase how much fiber you consume. If you increase your intake of dietary fiber too quickly, you may have bloating, cramping, or gas.  Drink plenty of water. Water helps you to digest fiber. What are tips for following this plan?  Eat a wide variety of high-fiber foods.  Make sure that half of the grains that you eat each day are whole grains.  Eat breads and cereals that are made with whole-grain flour instead of refined flour or white flour.  Eat brown rice, bulgur wheat, or millet instead of white rice.  Start the day with a breakfast that is high in fiber, such as a cereal that contains 5 g of fiber or more per serving.  Use beans in place of meat in soups, salads, and pasta dishes.  Eat high-fiber snacks, such as berries, raw vegetables, nuts, and popcorn.  Choose whole fruits and vegetables instead of processed forms like juice or sauce. What foods can I eat?  Fruits Berries. Pears. Apples. Oranges. Avocado. Prunes and raisins. Dried figs. Vegetables Sweet potatoes. Spinach. Kale. Artichokes. Cabbage. Broccoli. Cauliflower. Green peas. Carrots. Squash. Grains Whole-grain breads. Multigrain cereal. Oats and oatmeal. Brown rice. Barley. Bulgur wheat. [place]. Quinoa. Bran muffins. Popcorn. Rye wafer crackers. Meats and other proteins Navy, kidney, and pinto beans.  Soybeans. Split peas. Lentils. Nuts and seeds. Dairy Fiber-fortified yogurt. Beverages Fiber-fortified soy milk. Fiber-fortified orange juice. Other foods Fiber bars. The items listed above may not be a complete list of recommended foods and beverages. Contact a dietitian for more options. What foods are not recommended? Fruits Fruit juice. Cooked, strained fruit. Vegetables Fried potatoes. Canned vegetables. Well-cooked vegetables. Grains White bread. Pasta made with refined flour. White rice. Meats and other proteins Fatty cuts of meat. Fried chicken or fried fish. Dairy Milk. Yogurt. Cream cheese. Sour cream. Fats and oils Butters. Beverages Soft drinks. Other foods Cakes and pastries. The items listed above may not be a complete list of foods and beverages to avoid. Contact a dietitian for more information. Summary  Fiber is a type of carbohydrate. It is found in fruits, vegetables, whole grains, and beans.  There are many health benefits of eating a high-fiber diet, such as preventing constipation, lowering blood cholesterol, helping with weight loss, and reducing your risk of heart disease, diabetes, and certain cancers.  Gradually increase your intake of fiber. Increasing too fast can result in cramping, bloating, and gas. Drink plenty of water while you increase your fiber.  The best sources of fiber include whole fruits and vegetables, whole grains, nuts, seeds, and beans. This information is not intended to replace advice given to you by your health care provider. Make sure you discuss any questions you have with your health care provider. Document Released: 07/28/2005 Document Revised: 06/01/2017 Document Reviewed: 06/01/2017 Elsevier Interactive Patient Education  2019 [employer].

## 2019-01-05 NOTE — H&P (Signed)
@LOGO @   Primary Care Physician:  Sharilyn Sites, MD Primary Gastroenterologist:  Dr. Gala Romney  Pre-Procedure History & Physical: HPI:  Patrick Moore is a 58 y.o. male here for surveillance colonoscopy.  Personal history of colonic adenoma.  Now multiple degree relatives with colorectal cancer.  No bowel symptoms currently.  Past Medical History:  Diagnosis Date  . Hypercholesteremia   . Hypertension   . Lumbar disc herniation   . Plantar fasciitis     Past Surgical History:  Procedure Laterality Date  . CARDIAC CATHETERIZATION    . COLONOSCOPY N/A 07/26/2014   Procedure: COLONOSCOPY;  Surgeon: Daneil Dolin, MD;  Location: [facility];  Service: Endoscopy;  Laterality: N/A;  7:30 AM  . INGUINAL LYMPH NODE BIOPSY     as a child    Prior to Admission medications   Medication Sig Start Date End Date Taking? Authorizing Provider  Fish Oil OIL Take 1,000 mg by mouth daily.    Yes [provider]  losartan (COZAAR) 100 MG tablet Take 100 mg by mouth every morning.  09/21/18  Yes [provider]  Menthol, Topical Analgesic, (BIOFREEZE EX) Apply 1 application topically daily.   Yes [provider]  Multiple Vitamin (MULTIVITAMIN) capsule Take 1 capsule by mouth daily.   Yes [provider]  Multiple Vitamins-Minerals (EMERGEN-C VITAMIN C PO) Take 1,000 mg by mouth daily.   Yes [provider]  rosuvastatin (CRESTOR) 40 MG tablet Take 40 mg by mouth every morning.  08/18/18  Yes [provider]  TURMERIC PO Take 1,000 mg by mouth daily.    Yes [provider]    Allergies as of 10/27/2018  . (No Known Allergies)    Family History  Problem Relation Age of Onset  . Colon cancer Sister 53       passed 4 months after diagnosis    Social History   Socioeconomic History  . Marital status: Married    Spouse name: Not on file  . Number of children: Not on file  . Years of education: Not on file  . Highest education  level: Not on file  Occupational History  . Not on file  Social Needs  . Financial resource strain: Not on file  . Food insecurity:    Worry: Not on file    Inability: Not on file  . Transportation needs:    Medical: Not on file    Non-medical: Not on file  Tobacco Use  . Smoking status: Never Smoker  . Smokeless tobacco: Never Used  Substance and Sexual Activity  . Alcohol use: Yes    Comment: beer three times weekly  . Drug use: No  . Sexual activity: Not on file  Lifestyle  . Physical activity:    Days per week: Not on file    Minutes per session: Not on file  . Stress: Not on file  Relationships  . Social connections:    Talks on phone: Not on file    Gets together: Not on file    Attends religious service: Not on file    Active member of club or organization: Not on file    Attends meetings of clubs or organizations: Not on file    Relationship status: Not on file  . Intimate partner violence:    Fear of current or ex partner: Not on file    Emotionally abused: Not on file    Physically abused: Not on file    Forced sexual  activity: Not on file  Other Topics Concern  . Not on file  Social History Narrative  . Not on file    Review of Systems: See HPI, otherwise negative ROS  Physical Exam: BP (!) 148/93   Pulse 72   Temp 98.1 F (36.7 C) (Oral)   Resp 17   Ht 6' (1.829 m)   Wt 91.6 kg   SpO2 100%   BMI 27.40 kg/m  General:   Alert,  Well-developed, well-nourished, pleasant and cooperative in NAD icant cervical adenopathy. Lungs:  Clear throughout to auscultation.   No wheezes, crackles, or rhonchi. No acute distress. Heart:  Regular rate and rhythm; no murmurs, clicks, rubs,  or gallops. Abdomen: Non-distended, normal bowel sounds.  Soft and nontender without appreciable mass or hepatosplenomegaly.  Pulses:  Normal pulses noted. Extremities:  Without clubbing or edema.  Impression/Plan: 58 year old gentleman here for surveillance colonoscopy.   Multiple family members with colon cancer.  History of colonic adenoma  The risks, benefits, limitations, alternatives and imponderables have been reviewed with the patient. Questions have been answered. All parties are agreeable.      Notice: This dictation was prepared with Dragon dictation along with smaller phrase technology. Any transcriptional errors that result from this process are unintentional and may not be corrected upon review.

## 2019-01-05 NOTE — Op Note (Signed)
[facility] Patient Name: Patrick Moore Procedure Date: 01/05/2019 9:00 AM MRN: [id number] Date of Birth: Jun 07, 1961 Attending MD: Norvel Richards , MD CSN: [id number] Age: 58 Admit Type: Outpatient Procedure:                Colonoscopy Indications:              High risk colon cancer surveillance: Personal                            history of colonic polyps Providers:                Norvel Richards, MD, Charlsie Quest. Theda Sers RN, RN,                            Raphael Gibney, Technician, Aram Candela Referring MD:              Medicines:                Meperidine 50 mg IV, Midazolam 9 mg IV Complications:            No immediate complications. Estimated Blood Loss:     Estimated blood loss was minimal. Procedure:                Pre-Anesthesia Assessment:                           - Prior to the procedure, a History and Physical                            was performed, and patient medications and                            allergies were reviewed. The patient's tolerance of                            previous anesthesia was also reviewed. The risks                            and benefits of the procedure and the sedation                            options and risks were discussed with the patient.                            All questions were answered, and informed consent                            was obtained. Prior Anticoagulants: The patient has                            taken no previous anticoagulant or antiplatelet                            agents. ASA Grade Assessment: II - A patient with  mild systemic disease. After reviewing the risks                            and benefits, the patient was deemed in                            satisfactory condition to undergo the procedure.                           After obtaining informed consent, the colonoscope                            was passed under direct vision. Throughout the                 procedure, the patient's blood pressure, pulse, and                            oxygen saturations were monitored continuously. The                            CF-HQ190L ([id number]) scope was introduced through                            the anus and advanced to the the cecum, identified                            by appendiceal orifice and ileocecal valve. The                            colonoscopy was performed without difficulty. The                            patient tolerated the procedure well. The quality                            of the bowel preparation was adequate. The                            ileocecal valve, appendiceal orifice, and rectum                            were photographed. Scope In: 10:11:05 AM Scope Out: 10:30:10 AM Scope Withdrawal Time: 0 hours 11 minutes 31 seconds  Total Procedure Duration: 0 hours 19 minutes 5 seconds  Findings:      The perianal and digital rectal examinations were normal.      Non-bleeding internal hemorrhoids were found during retroflexion. The       hemorrhoids were mild, small and Grade I (internal hemorrhoids that do       not prolapse).      A 5 mm polyp was found in the ascending colon. The polyp was sessile.       The polyp was removed with a cold snare. Resection and retrieval were       complete. Estimated blood loss was minimal.      Scattered medium-mouthed diverticula were  found in the sigmoid colon and       descending colon.      The exam was otherwise without abnormality on direct and retroflexion       views. Impression:               - Non-bleeding internal hemorrhoids.                           - One 5 mm polyp in the ascending colon, removed                            with a cold snare. Resected and retrieved.                           - Diverticulosis in the sigmoid colon and in the                            descending colon.                           - The examination was otherwise normal on direct                             and retroflexion views. Moderate Sedation:      Moderate (conscious) sedation was administered by the endoscopy nurse       and supervised by the endoscopist. The following parameters were       monitored: oxygen saturation, heart rate, blood pressure, respiratory       rate, EKG, adequacy of pulmonary ventilation, and response to care.       Total physician intraservice time was 25 minutes. Recommendation:           - Patient has a contact number available for                            emergencies. The signs and symptoms of potential                            delayed complications were discussed with the                            patient. Return to normal activities tomorrow.                            Written discharge instructions were provided to the                            patient.                           - Resume previous diet.                           - Continue present medications.                           - Repeat colonoscopy date to  be determined after                            pending pathology results are reviewed for                            surveillance.                           - Return to GI office (date not yet determined). Procedure Code(s):        --- Professional ---                           [phone], Colonoscopy, flexible; with removal of                            tumor(s), polyp(s), or other lesion(s) by snare                            technique                           99153, Moderate sedation; each additional 15                            minutes intraservice time                           G0500, Moderate sedation services provided by the                            same physician or other qualified health care                            professional performing a gastrointestinal                            endoscopic service that sedation supports,                            requiring the presence of an independent trained                             observer to assist in the monitoring of the                            patient's level of consciousness and physiological                            status; initial 15 minutes of intra-service time;                            patient age 56 years or older (additional time may                            be reported with  99153, as appropriate) Diagnosis Code(s):        --- Professional ---                           Z86.010, Personal history of colonic polyps                           K63.5, Polyp of colon                           K64.0, First degree hemorrhoids                           K57.30, Diverticulosis of large intestine without                            perforation or abscess without bleeding CPT copyright 2019 American Medical Association. All rights reserved. The codes documented in this report are preliminary and upon coder review may  be revised to meet current compliance requirements. Cristopher Estimable. Chariah Bailey, MD Norvel Richards, MD 01/05/2019 10:43:01 AM This report has been signed electronically. Number of Addenda: 0

## 2019-01-06 ENCOUNTER — Encounter: Payer: Self-pay | Admitting: Internal Medicine

## 2019-01-06 ENCOUNTER — Encounter (HOSPITAL_COMMUNITY): Payer: Self-pay | Admitting: Internal Medicine

## 2019-02-01 ENCOUNTER — Telehealth: Payer: Self-pay | Admitting: Internal Medicine

## 2019-02-01 NOTE — Telephone Encounter (Signed)
[phone] Patient called and stated he received a 4000$ bill for his tcs.  I explained to him that if they removed any polyps during the procedure it would  have become a diagnostic rather than a screening preventative test.  He expressed understanding and asked if I could still put in a note to the manager because they told him from billing to call here.  He ended the call stating that he guessed he will just owe that money since they removed a polyp.

## 2019-02-01 NOTE — Telephone Encounter (Signed)
Noted  

## 2019-03-11 MED FILL — ROSUVASTATIN CALCIUM 40 MG: 40 | 90 days supply | Qty: 90 | Fill #0

## 2019-03-11 MED FILL — LOSARTAN POTASSIUM 100 MG T: 100 | 90 days supply | Qty: 90 | Fill #0

## 2019-04-28 DIAGNOSIS — N529 Male erectile dysfunction, unspecified: Secondary | ICD-10-CM | POA: Diagnosis not present

## 2019-04-28 DIAGNOSIS — Z0001 Encounter for general adult medical examination with abnormal findings: Secondary | ICD-10-CM | POA: Diagnosis not present

## 2019-04-28 DIAGNOSIS — Z1389 Encounter for screening for other disorder: Secondary | ICD-10-CM | POA: Diagnosis not present

## 2019-04-28 DIAGNOSIS — R5383 Other fatigue: Secondary | ICD-10-CM | POA: Diagnosis not present

## 2019-04-28 DIAGNOSIS — Z0000 Encounter for general adult medical examination without abnormal findings: Secondary | ICD-10-CM | POA: Diagnosis not present

## 2019-04-28 DIAGNOSIS — R7309 Other abnormal glucose: Secondary | ICD-10-CM | POA: Diagnosis not present

## 2019-04-28 DIAGNOSIS — I10 Essential (primary) hypertension: Secondary | ICD-10-CM | POA: Diagnosis not present

## 2019-04-28 DIAGNOSIS — M1990 Unspecified osteoarthritis, unspecified site: Secondary | ICD-10-CM | POA: Diagnosis not present

## 2019-04-28 DIAGNOSIS — Z6828 Body mass index (BMI) 28.0-28.9, adult: Secondary | ICD-10-CM | POA: Diagnosis not present

## 2019-04-28 MED FILL — OLMESARTAN MEDOXOMIL 40 MG: 40 | 90 days supply | Qty: 90 | Fill #0

## 2019-06-16 MED FILL — LOSARTAN POTASSIUM 100 MG T: 100 | 90 days supply | Qty: 90 | Fill #0

## 2019-06-16 MED FILL — ROSUVASTATIN CALCIUM 40 MG: 40 | 90 days supply | Qty: 90 | Fill #0

## 2019-09-20 MED FILL — ROSUVASTATIN CALCIUM 40 MG: 40 | 90 days supply | Qty: 90 | Fill #1

## 2019-09-20 MED FILL — LOSARTAN POTASSIUM 100 MG T: 100 | 90 days supply | Qty: 90 | Fill #1

## 2019-10-03 DIAGNOSIS — H5203 Hypermetropia, bilateral: Secondary | ICD-10-CM | POA: Diagnosis not present

## 2019-10-03 DIAGNOSIS — H25813 Combined forms of age-related cataract, bilateral: Secondary | ICD-10-CM | POA: Diagnosis not present

## 2019-10-03 DIAGNOSIS — H524 Presbyopia: Secondary | ICD-10-CM | POA: Diagnosis not present

## 2019-10-03 DIAGNOSIS — H53022 Refractive amblyopia, left eye: Secondary | ICD-10-CM | POA: Diagnosis not present

## 2019-10-03 DIAGNOSIS — H52223 Regular astigmatism, bilateral: Secondary | ICD-10-CM | POA: Diagnosis not present

## 2019-10-13 ENCOUNTER — Ambulatory Visit: Payer: 59 | Attending: Internal Medicine

## 2019-10-13 ENCOUNTER — Other Ambulatory Visit: Payer: Self-pay

## 2019-10-13 DIAGNOSIS — Z23 Encounter for immunization: Secondary | ICD-10-CM | POA: Insufficient documentation

## 2019-10-13 NOTE — Progress Notes (Signed)
   Covid-19 Vaccination Clinic  Name:  Patrick Moore    MRN: [id number] DOB: [birth year]  10/13/2019  [name] was observed post Covid-19 immunization for 15 minutes without incident. He was provided with Vaccine Information Sheet and instruction to access the V-Safe system.   [name] was instructed to call 911 with any severe reactions post vaccine: Marland Kitchen Difficulty breathing  . Swelling of face and throat  . A fast heartbeat  . A bad rash all over body  . Dizziness and weakness   Immunizations Administered    Name Date Dose VIS Date Route   Moderna COVID-19 Vaccine 10/13/2019  8:34 AM 0.5 mL 07/12/2019 Intramuscular   Manufacturer: Moderna   Lot: [id number]   [id number]

## 2019-11-15 ENCOUNTER — Ambulatory Visit: Payer: 59 | Attending: Internal Medicine

## 2019-11-15 DIAGNOSIS — Z23 Encounter for immunization: Secondary | ICD-10-CM

## 2019-11-15 NOTE — Progress Notes (Signed)
   Covid-19 Vaccination Clinic  Name:  MAHDY LONSDALE    MRN: [id number] DOB: Apr 13, 1961  11/15/2019  Mr. Hopp was observed post Covid-19 immunization for 15 minutes without incident. He was provided with Vaccine Information Sheet and instruction to access the V-Safe system.   Mr. Manella was instructed to call 911 with any severe reactions post vaccine: Marland Kitchen Difficulty breathing  . Swelling of face and throat  . A fast heartbeat  . A bad rash all over body  . Dizziness and weakness   Immunizations Administered    Name Date Dose VIS Date Route   Moderna COVID-19 Vaccine 11/15/2019  8:34 AM 0.5 mL 07/12/2019 Intramuscular   Manufacturer: Moderna   LotMV:[id number]   MunjorBE:[id number]

## 2019-12-12 MED FILL — LOSARTAN POTASSIUM 100 MG T: 100 | 90 days supply | Qty: 90 | Fill #2

## 2019-12-12 MED FILL — ROSUVASTATIN CALCIUM 40 MG: 40 | 90 days supply | Qty: 90 | Fill #2

## 2020-03-12 MED FILL — LOSARTAN POTASSIUM 100 MG T: 100 | 90 days supply | Qty: 90 | Fill #3

## 2020-03-12 MED FILL — ROSUVASTATIN CALCIUM 40 MG: 40 | 90 days supply | Qty: 90 | Fill #3

## 2020-05-01 DIAGNOSIS — M1990 Unspecified osteoarthritis, unspecified site: Secondary | ICD-10-CM | POA: Diagnosis not present

## 2020-05-01 DIAGNOSIS — Z23 Encounter for immunization: Secondary | ICD-10-CM | POA: Diagnosis not present

## 2020-05-01 DIAGNOSIS — I10 Essential (primary) hypertension: Secondary | ICD-10-CM | POA: Diagnosis not present

## 2020-05-01 DIAGNOSIS — Z1389 Encounter for screening for other disorder: Secondary | ICD-10-CM | POA: Diagnosis not present

## 2020-05-01 DIAGNOSIS — Z0001 Encounter for general adult medical examination with abnormal findings: Secondary | ICD-10-CM | POA: Diagnosis not present

## 2020-05-01 DIAGNOSIS — R7309 Other abnormal glucose: Secondary | ICD-10-CM | POA: Diagnosis not present

## 2020-05-01 DIAGNOSIS — E785 Hyperlipidemia, unspecified: Secondary | ICD-10-CM | POA: Diagnosis not present

## 2020-05-01 DIAGNOSIS — Z6828 Body mass index (BMI) 28.0-28.9, adult: Secondary | ICD-10-CM | POA: Diagnosis not present

## 2020-05-01 DIAGNOSIS — R001 Bradycardia, unspecified: Secondary | ICD-10-CM | POA: Diagnosis not present

## 2020-06-07 ENCOUNTER — Ambulatory Visit: Payer: 59 | Attending: Internal Medicine

## 2020-06-07 DIAGNOSIS — Z23 Encounter for immunization: Secondary | ICD-10-CM

## 2020-06-07 NOTE — Progress Notes (Signed)
   Covid-19 Vaccination Clinic  Name:  JACQUESE HACKMAN    MRN: [id number] DOB: 08-May-1961  06/07/2020  Mr. Farver was observed post Covid-19 immunization for 15 minutes without incident. He was provided with Vaccine Information Sheet and instruction to access the V-Safe system.   Mr. Lewellen was instructed to call 911 with any severe reactions post vaccine: Marland Kitchen Difficulty breathing  . Swelling of face and throat  . A fast heartbeat  . A bad rash all over body  . Dizziness and weakness

## 2020-06-13 ENCOUNTER — Other Ambulatory Visit (HOSPITAL_COMMUNITY): Payer: Self-pay | Admitting: Family Medicine

## 2020-06-13 MED FILL — ROSUVASTATIN CALCIUM 40 MG: 40 | 90 days supply | Qty: 90 | Fill #0

## 2020-06-13 MED FILL — LOSARTAN POTASSIUM 100 MG T: 100 | 90 days supply | Qty: 90 | Fill #0

## 2020-09-11 MED FILL — ROSUVASTATIN CALCIUM 40 MG: 40 | 90 days supply | Qty: 90 | Fill #1

## 2020-09-11 MED FILL — LOSARTAN POTASSIUM 100 MG T: 100 | 90 days supply | Qty: 90 | Fill #1

## 2020-12-05 ENCOUNTER — Other Ambulatory Visit (HOSPITAL_COMMUNITY): Payer: Self-pay

## 2020-12-06 ENCOUNTER — Other Ambulatory Visit (HOSPITAL_COMMUNITY): Payer: Self-pay

## 2020-12-06 MED ORDER — LOSARTAN POTASSIUM 100 MG PO TABS
1.0000 | ORAL_TABLET | Freq: Every day | ORAL | 1 refills | Status: DC
  Filled 2020-12-06: qty 90, 90d supply, fill #0
  Filled 2021-03-05: qty 90, 90d supply, fill #1

## 2020-12-06 MED ORDER — ROSUVASTATIN CALCIUM 40 MG PO TABS
40.0000 mg | ORAL_TABLET | Freq: Every day | ORAL | 1 refills | Status: DC
  Filled 2020-12-06: qty 90, 90d supply, fill #0
  Filled 2021-03-05: qty 90, 90d supply, fill #1

## 2020-12-07 ENCOUNTER — Other Ambulatory Visit (HOSPITAL_COMMUNITY): Payer: Self-pay

## 2020-12-19 ENCOUNTER — Other Ambulatory Visit (HOSPITAL_COMMUNITY): Payer: Self-pay

## 2020-12-19 MED ORDER — DOXYCYCLINE HYCLATE 100 MG PO CAPS
100.0000 mg | ORAL_CAPSULE | Freq: Two times a day (BID) | ORAL | 0 refills | Status: DC
  Filled 2020-12-19: qty 14, 7d supply, fill #0

## 2021-03-05 ENCOUNTER — Other Ambulatory Visit (HOSPITAL_COMMUNITY): Payer: Self-pay

## 2021-03-12 ENCOUNTER — Other Ambulatory Visit (HOSPITAL_COMMUNITY): Payer: Self-pay

## 2021-03-12 MED ORDER — ZOSTER VAC RECOMB ADJUVANTED 50 MCG/0.5ML IM SUSR
INTRAMUSCULAR | 1 refills | Status: AC
  Filled 2021-03-12: qty 0.5, 1d supply, fill #0
  Filled 2021-05-20: qty 0.5, 1d supply, fill #1

## 2021-03-13 ENCOUNTER — Other Ambulatory Visit (HOSPITAL_COMMUNITY): Payer: Self-pay

## 2021-03-29 DIAGNOSIS — E663 Overweight: Secondary | ICD-10-CM | POA: Diagnosis not present

## 2021-03-29 DIAGNOSIS — K4090 Unilateral inguinal hernia, without obstruction or gangrene, not specified as recurrent: Secondary | ICD-10-CM | POA: Diagnosis not present

## 2021-03-29 DIAGNOSIS — Z6828 Body mass index (BMI) 28.0-28.9, adult: Secondary | ICD-10-CM | POA: Diagnosis not present

## 2021-04-11 ENCOUNTER — Other Ambulatory Visit: Payer: Self-pay

## 2021-04-11 ENCOUNTER — Encounter: Payer: Self-pay | Admitting: General Surgery

## 2021-04-11 ENCOUNTER — Ambulatory Visit (INDEPENDENT_AMBULATORY_CARE_PROVIDER_SITE_OTHER): Payer: 59 | Admitting: General Surgery

## 2021-04-11 VITALS — BP 126/83 | HR 79 | Temp 98.6°F | Resp 12 | Ht 72.0 in | Wt 200.0 lb

## 2021-04-11 DIAGNOSIS — K4090 Unilateral inguinal hernia, without obstruction or gangrene, not specified as recurrent: Secondary | ICD-10-CM | POA: Diagnosis not present

## 2021-04-11 NOTE — H&P (Signed)
Patrick Moore; [id number]; 05/28/1961   HPI Patient is a 60 year old white male who was referred to my care by Dr. Hilma Favors for evaluation and treatment of a left inguinal hernia.  Patient states recently he has developed increased swelling and pain in the left groin.  Is made worse with straining.  He denies any nausea or vomiting.  It is starting to affect his work.  The pain does radiate to the left scrotum. Past Medical History:  Diagnosis Date   Hypercholesteremia    Hypertension    Lumbar disc herniation    Plantar fasciitis     Past Surgical History:  Procedure Laterality Date   CARDIAC CATHETERIZATION     COLONOSCOPY N/A 07/26/2014   Procedure: COLONOSCOPY;  Surgeon: Daneil Dolin, MD;  Location: [facility];  Service: Endoscopy;  Laterality: N/A;  7:30 AM   COLONOSCOPY N/A 01/05/2019   Procedure: COLONOSCOPY;  Surgeon: Daneil Dolin, MD;  Location: [facility];  Service: Endoscopy;  Laterality: N/A;  10:30am   INGUINAL LYMPH NODE BIOPSY     as a child   POLYPECTOMY  01/05/2019   Procedure: POLYPECTOMY;  Surgeon: Daneil Dolin, MD;  Location: [facility];  Service: Endoscopy;;    Family History  Problem Relation Age of Onset   Colon cancer Sister 65       passed 4 months after diagnosis    Current Outpatient Medications on File Prior to Visit  Medication Sig Dispense Refill   Fish Oil OIL Take 1,000 mg by mouth daily.      losartan (COZAAR) 100 MG tablet TAKE 1 TABLET BY MOUTH ONCE DAILY 90 tablet 1   Multiple Vitamin (MULTIVITAMIN) capsule Take 1 capsule by mouth daily.     rosuvastatin (CRESTOR) 40 MG tablet TAKE 1 TABLET BY MOUTH DAILY 90 tablet 1   TURMERIC PO Take 1,000 mg by mouth daily.      No current facility-administered medications on file prior to visit.    No Known Allergies  Social History   Substance and Sexual Activity  Alcohol Use Yes   Comment: beer three times weekly    Social History   Tobacco Use  Smoking Status Never   Smokeless Tobacco Never    Review of Systems  Constitutional: Negative.   HENT: Negative.    Eyes: Negative.   Respiratory: Negative.    Cardiovascular: Negative.   Gastrointestinal: Negative.   Genitourinary: Negative.   Musculoskeletal:  Positive for back pain.  Skin: Negative.   Neurological: Negative.   Endo/Heme/Allergies: Negative.   Psychiatric/Behavioral: Negative.     Objective   Vitals:   04/11/21 1133  BP: 126/83  Pulse: 79  Resp: 12  Temp: 98.6 F (37 C)  SpO2: 96%    Physical Exam Vitals reviewed.  Constitutional:      Appearance: Normal appearance. He is normal weight. He is not ill-appearing.  HENT:     Head: Normocephalic and atraumatic.  Cardiovascular:     Rate and Rhythm: Normal rate and regular rhythm.     Heart sounds: Normal heart sounds. No murmur heard.   No gallop.  Pulmonary:     Effort: Pulmonary effort is normal. No respiratory distress.     Breath sounds: Normal breath sounds. No stridor. No wheezing, rhonchi or rales.  Abdominal:     General: Bowel sounds are normal. There is no distension.     Palpations: Abdomen is soft. There is no mass.     Tenderness: There  is no abdominal tenderness. There is no guarding or rebound.     Hernia: A hernia is present.     Comments: Patient has bilateral inguinal hernias.  Both are easily reducible.  Genitourinary:    Testes: Normal.  Skin:    General: Skin is warm and dry.  Neurological:     Mental Status: He is alert and oriented to person, place, and time.   Primary care notes reviewed Assessment  Symptomatic left inguinal hernia Asymptomatic right inguinal hernia Plan  Patient is scheduled for left inguinal herniorrhaphy with mesh on 04/24/2021.  The risks and benefits of the procedure including bleeding, infection, mesh use, and the possibility of recurrence of the hernia were fully explained to the patient, who gave informed consent.

## 2021-04-11 NOTE — Progress Notes (Signed)
Patrick Moore; [id number]; 06-29-1961   HPI Patient is a 60 year old white male who was referred to my care by Dr. Hilma Favors for evaluation and treatment of a left inguinal hernia.  Patient states recently he has developed increased swelling and pain in the left groin.  Is made worse with straining.  He denies any nausea or vomiting.  It is starting to affect his work.  The pain does radiate to the left scrotum. Past Medical History:  Diagnosis Date   Hypercholesteremia    Hypertension    Lumbar disc herniation    Plantar fasciitis     Past Surgical History:  Procedure Laterality Date   CARDIAC CATHETERIZATION     COLONOSCOPY N/A 07/26/2014   Procedure: COLONOSCOPY;  Surgeon: Daneil Dolin, MD;  [facility]: [facility];  Service: Endoscopy;  Laterality: N/A;  7:30 AM   COLONOSCOPY N/A 01/05/2019   Procedure: COLONOSCOPY;  Surgeon: Daneil Dolin, MD;  Location: [facility];  Service: Endoscopy;  Laterality: N/A;  10:30am   INGUINAL LYMPH NODE BIOPSY     as a child   POLYPECTOMY  01/05/2019   Procedure: POLYPECTOMY;  Surgeon: Daneil Dolin, MD;  Location: [facility];  Service: Endoscopy;;    Family History  Problem Relation Age of Onset   Colon cancer Sister 74       passed 4 months after diagnosis    Current Outpatient Medications on File Prior to Visit  Medication Sig Dispense Refill   Fish Oil OIL Take 1,000 mg by mouth daily.      losartan (COZAAR) 100 MG tablet TAKE 1 TABLET BY MOUTH ONCE DAILY 90 tablet 1   Multiple Vitamin (MULTIVITAMIN) capsule Take 1 capsule by mouth daily.     rosuvastatin (CRESTOR) 40 MG tablet TAKE 1 TABLET BY MOUTH DAILY 90 tablet 1   TURMERIC PO Take 1,000 mg by mouth daily.      No current facility-administered medications on file prior to visit.    No Known Allergies  Social History   Substance and Sexual Activity  Alcohol Use Yes   Comment: beer three times weekly    Social History   Tobacco Use  Smoking Status Never   Smokeless Tobacco Never    Review of Systems  Constitutional: Negative.   HENT: Negative.    Eyes: Negative.   Respiratory: Negative.    Cardiovascular: Negative.   Gastrointestinal: Negative.   Genitourinary: Negative.   Musculoskeletal:  Positive for back pain.  Skin: Negative.   Neurological: Negative.   Endo/Heme/Allergies: Negative.   Psychiatric/Behavioral: Negative.     Objective   Vitals:   04/11/21 1133  BP: 126/83  Pulse: 79  Resp: 12  Temp: 98.6 F (37 C)  SpO2: 96%    Physical Exam Vitals reviewed.  Constitutional:      Appearance: Normal appearance. He is normal weight. He is not ill-appearing.  HENT:     Head: Normocephalic and atraumatic.  Cardiovascular:     Rate and Rhythm: Normal rate and regular rhythm.     Heart sounds: Normal heart sounds. No murmur heard.   No gallop.  Pulmonary:     Effort: Pulmonary effort is normal. No respiratory distress.     Breath sounds: Normal breath sounds. No stridor. No wheezing, rhonchi or rales.  Abdominal:     General: Bowel sounds are normal. There is no distension.     Palpations: Abdomen is soft. There is no mass.     Tenderness: There  is no abdominal tenderness. There is no guarding or rebound.     Hernia: A hernia is present.     Comments: Patient has bilateral inguinal hernias.  Both are easily reducible.  Genitourinary:    Testes: Normal.  Skin:    General: Skin is warm and dry.  Neurological:     Mental Status: He is alert and oriented to person, place, and time.   Primary care notes reviewed Assessment  Symptomatic left inguinal hernia Asymptomatic right inguinal hernia Plan  Patient is scheduled for left inguinal herniorrhaphy with mesh on 04/24/2021.  The risks and benefits of the procedure including bleeding, infection, mesh use, and the possibility of recurrence of the hernia were fully explained to the patient, who gave informed consent.

## 2021-04-18 NOTE — Patient Instructions (Signed)
Patrick Moore  04/18/2021     '@PREFPERIOPPHARMACY'$ @   Your procedure is scheduled on 04/24/2021.   [facility] to [name] [place]   Call this number if you have problems the morning of surgery:  [phone]   Remember:  Do not eat or drink after midnight.      Take these medicines the morning of surgery with A SIP OF WATER                                         None     Do not wear jewelry, make-up or nail polish.  Do not wear lotions, powders, or perfumes, or deodorant.  Do not shave 48 hours prior to surgery.  Men may shave face and neck.  Do not bring valuables to the hospital.  [facility] is not responsible for any belongings or valuables.  Contacts, dentures or bridgework may not be worn into surgery.  Leave your suitcase in the car.  After surgery it may be brought to your room.  For patients admitted to the hospital, discharge time will be determined by your treatment team.  Patients discharged the day of surgery will not be allowed to drive home and must have someone with them for 24 hours.    Special instructions:   DO NOT smoke tobacco or vape for 24 hours.  Please read over the following fact sheets that you were given. Coughing and Deep Breathing, Surgical Site Infection Prevention, Anesthesia Post-op Instructions, and Care and Recovery After Surgery      Open Hernia Repair, Adult, Care After What can I expect after the procedure? After the procedure, it is common to have: Mild discomfort. Slight bruising. Mild swelling. Pain in the belly (abdomen). A small amount of blood from the cut from surgery (incision). Follow these instructions at home: Your doctor may give you more specific instructions. If you have problems, call your doctor. Medicines Take over-the-counter and prescription medicines only as told by your doctor. If told, take steps to prevent problems with pooping (constipation). You may need to: Drink enough fluid to  keep your pee (urine) pale yellow. Take medicines. You will be told what medicines to take. Eat foods that are high in fiber. These include beans, whole grains, and fresh fruits and vegetables. Limit foods that are high in fat and sugar. These include fried or sweet foods. Ask your doctor if you should avoid driving or using machines while you are taking your medicine. Incision care  Follow instructions from your doctor about how to take care of your incision. Make sure you: Wash your hands with soap and water for at least 20 seconds before and after you change your bandage (dressing). If you cannot use soap and water, use hand sanitizer. Change your bandage. Leave stitches or skin glue in place for at least 2 weeks. Leave tape strips alone unless you are told to take them off. You may trim the edges of the tape strips if they curl up. Check your incision every day for signs of infection. Check for: More redness, swelling, or pain. More fluid or blood. Warmth. Pus or a bad smell. Wear loose, soft clothing while your incision heals. Activity  Rest as told by your doctor. Do not lift anything that is heavier than 10 lb (4.5 kg), or the limit that you  are told. Do not play contact sports until your doctor says that this is safe. If you were given a sedative during your procedure, do not drive or use machines until your doctor says that it is safe. A sedative is a medicine that helps you relax. Return to your normal activities when your doctor says that it is safe. General instructions Do not take baths, swim, or use a hot tub. Ask your doctor about taking showers or sponge baths. Hold a pillow over your belly when you cough or sneeze. This helps with pain. Do not smoke or use any products that contain nicotine or tobacco. If you need help quitting, ask your doctor. Keep all follow-up visits. Contact a doctor if: You have any of these signs of infection in or around your incision: More  redness, swelling, or pain. More fluid or blood. Warmth. Pus. A bad smell. You have a fever or chills. You have blood in your poop (stool). You have not pooped (had a bowel movement) in 2-3 days. Medicine does not help your pain. Get help right away if: You have chest pain, or you are short of breath. You feel faint or light-headed. You have very bad pain. You vomit and your pain is worse. You have pain, swelling, or redness in a leg. These symptoms may be an emergency. Get help right away. Call your local emergency services (911 in the [place] Do not wait to see if the symptoms will go away. Do not drive yourself to the hospital. Summary After this procedure, it is common to have mild discomfort, slight bruising, and mild swelling. Follow instructions from your doctor about how to take care of your cut from surgery (incision). Check every day for signs of infection. Do not lift heavy objects or play contact sports until your doctor says it is safe. Return to your normal activities as told by your doctor. This information is not intended to replace advice given to you by your health care provider. Make sure you discuss any questions you have with your health care provider. Document Revised: 03/12/2020 Document Reviewed: 03/12/2020 Elsevier Patient Education  2022 [place] Anesthesia, Adult, Care After This sheet gives you information about how to care for yourself after your procedure. Your health care provider may also give you more specific instructions. If you have problems or questions, contact your health care provider. What can I expect after the procedure? After the procedure, the following side effects are common: Pain or discomfort at the IV site. Nausea. Vomiting. Sore throat. Trouble concentrating. Feeling cold or chills. Feeling weak or tired. Sleepiness and fatigue. Soreness and body aches. These side effects can affect parts of the body that were not  involved in surgery. Follow these instructions at home: For the time period you were told by your health care provider:  Rest. Do not participate in activities where you could fall or become injured. Do not drive or use machinery. Do not drink alcohol. Do not take sleeping pills or medicines that cause drowsiness. Do not make important decisions or sign legal documents. Do not take care of children on your own. Eating and drinking Follow any instructions from your health care provider about eating or drinking restrictions. When you feel hungry, start by eating small amounts of foods that are soft and easy to digest (bland), such as toast. Gradually return to your regular diet. Drink enough fluid to keep your urine pale yellow. If you vomit, rehydrate by drinking water, juice, or clear  broth. General instructions If you have sleep apnea, surgery and certain medicines can increase your risk for breathing problems. Follow instructions from your health care provider about wearing your sleep device: Anytime you are sleeping, including during daytime naps. While taking prescription pain medicines, sleeping medicines, or medicines that make you drowsy. Have a responsible adult stay with you for the time you are told. It is important to have someone help care for you until you are awake and alert. Return to your normal activities as told by your health care provider. Ask your health care provider what activities are safe for you. Take over-the-counter and prescription medicines only as told by your health care provider. If you smoke, do not smoke without supervision. Keep all follow-up visits as told by your health care provider. This is important. Contact a health care provider if: You have nausea or vomiting that does not get better with medicine. You cannot eat or drink without vomiting. You have pain that does not get better with medicine. You are unable to pass urine. You develop a skin  rash. You have a fever. You have redness around your IV site that gets worse. Get help right away if: You have difficulty breathing. You have chest pain. You have blood in your urine or stool, or you vomit blood. Summary After the procedure, it is common to have a sore throat or nausea. It is also common to feel tired. Have a responsible adult stay with you for the time you are told. It is important to have someone help care for you until you are awake and alert. When you feel hungry, start by eating small amounts of foods that are soft and easy to digest (bland), such as toast. Gradually return to your regular diet. Drink enough fluid to keep your urine pale yellow. Return to your normal activities as told by your health care provider. Ask your health care provider what activities are safe for you. This information is not intended to replace advice given to you by your health care provider. Make sure you discuss any questions you have with your health care provider. Document Revised: 04/12/2020 Document Reviewed: 11/10/2019 Elsevier Patient Education  2022 [place]. How to Use Chlorhexidine for Bathing Chlorhexidine gluconate (CHG) is a germ-killing (antiseptic) solution that is used to clean the skin. It can get rid of the bacteria that normally live on the skin and can keep them away for about 24 hours. To clean your skin with CHG, you may be given: A CHG solution to use in the shower or as part of a sponge bath. A prepackaged cloth that contains CHG. Cleaning your skin with CHG may help lower the risk for infection: While you are staying in the intensive care unit of the hospital. If you have a vascular access, such as a central line, to provide short-term or long-term access to your veins. If you have a catheter to drain urine from your bladder. If you are on a ventilator. A ventilator is a machine that helps you breathe by moving air in and out of your lungs. After surgery. What  are the risks? Risks of using CHG include: A skin reaction. Hearing loss, if CHG gets in your ears and you have a perforated eardrum. Eye injury, if CHG gets in your eyes and is not rinsed out. The CHG product catching fire. Make sure that you avoid smoking and flames after applying CHG to your skin. Do not use CHG: If you have a chlorhexidine  allergy or have previously reacted to chlorhexidine. On babies younger than 71 months of age. How to use CHG solution Use CHG only as told by your health care provider, and follow the instructions on the label. Use the full amount of CHG as directed. Usually, this is one bottle. During a shower Follow these steps when using CHG solution during a shower (unless your health care provider gives you different instructions): Start the shower. Use your normal soap and shampoo to wash your face and hair. Turn off the shower or move out of the shower stream. Pour the CHG onto a clean washcloth. Do not use any type of brush or rough-edged sponge. Starting at your neck, lather your body down to your toes. Make sure you follow these instructions: If you will be having surgery, pay special attention to the part of your body where you will be having surgery. Scrub this area for at least 1 minute. Do not use CHG on your head or face. If the solution gets into your ears or eyes, rinse them well with water. Avoid your genital area. Avoid any areas of skin that have broken skin, cuts, or scrapes. Scrub your back and under your arms. Make sure to wash skin folds. Let the lather sit on your skin for 1-2 minutes or as long as told by your health care provider. Thoroughly rinse your entire body in the shower. Make sure that all body creases and crevices are rinsed well. Dry off with a clean towel. Do not put any substances on your body afterward--such as powder, lotion, or perfume--unless you are told to do so by your health care provider. Only use lotions that are  recommended by the manufacturer. Put on clean clothes or pajamas. If it is the night before your surgery, sleep in clean sheets.  During a sponge bath Follow these steps when using CHG solution during a sponge bath (unless your health care provider gives you different instructions): Use your normal soap and shampoo to wash your face and hair. Pour the CHG onto a clean washcloth. Starting at your neck, lather your body down to your toes. Make sure you follow these instructions: If you will be having surgery, pay special attention to the part of your body where you will be having surgery. Scrub this area for at least 1 minute. Do not use CHG on your head or face. If the solution gets into your ears or eyes, rinse them well with water. Avoid your genital area. Avoid any areas of skin that have broken skin, cuts, or scrapes. Scrub your back and under your arms. Make sure to wash skin folds. Let the lather sit on your skin for 1-2 minutes or as long as told by your health care provider. Using a different clean, wet washcloth, thoroughly rinse your entire body. Make sure that all body creases and crevices are rinsed well. Dry off with a clean towel. Do not put any substances on your body afterward--such as powder, lotion, or perfume--unless you are told to do so by your health care provider. Only use lotions that are recommended by the manufacturer. Put on clean clothes or pajamas. If it is the night before your surgery, sleep in clean sheets. How to use CHG prepackaged cloths Only use CHG cloths as told by your health care provider, and follow the instructions on the label. Use the CHG cloth on clean, dry skin. Do not use the CHG cloth on your head or face unless your health care  provider tells you to. When washing with the CHG cloth: Avoid your genital area. Avoid any areas of skin that have broken skin, cuts, or scrapes. Before surgery Follow these steps when using a CHG cloth to clean before  surgery (unless your health care provider gives you different instructions): Using the CHG cloth, vigorously scrub the part of your body where you will be having surgery. Scrub using a back-and-forth motion for 3 minutes. The area on your body should be completely wet with CHG when you are done scrubbing. Do not rinse. Discard the cloth and let the area air-dry. Do not put any substances on the area afterward, such as powder, lotion, or perfume. Put on clean clothes or pajamas. If it is the night before your surgery, sleep in clean sheets.  For general bathing Follow these steps when using CHG cloths for general bathing (unless your health care provider gives you different instructions). Use a separate CHG cloth for each area of your body. Make sure you wash between any folds of skin and between your fingers and toes. Wash your body in the following order, switching to a new cloth after each step: The front of your neck, shoulders, and chest. Both of your arms, under your arms, and your hands. Your stomach and groin area, avoiding the genitals. Your right leg and foot. Your left leg and foot. The back of your neck, your back, and your buttocks. Do not rinse. Discard the cloth and let the area air-dry. Do not put any substances on your body afterward--such as powder, lotion, or perfume--unless you are told to do so by your health care provider. Only use lotions that are recommended by the manufacturer. Put on clean clothes or pajamas. Contact a health care provider if: Your skin gets irritated after scrubbing. You have questions about using your solution or cloth. You swallow any chlorhexidine. Call your local poison control center (1-(517)032-6369 in the [place] Get help right away if: Your eyes itch badly, or they become very red or swollen. Your skin itches badly and is red or swollen. Your hearing changes. You have trouble seeing. You have swelling or tingling in your mouth or throat. You  have trouble breathing. These symptoms may represent a serious problem that is an emergency. Do not wait to see if the symptoms will go away. Get medical help right away. Call your local emergency services (911 in the [place] Do not drive yourself to the hospital. Summary Chlorhexidine gluconate (CHG) is a germ-killing (antiseptic) solution that is used to clean the skin. Cleaning your skin with CHG may help to lower your risk for infection. You may be given CHG to use for bathing. It may be in a bottle or in a prepackaged cloth to use on your skin. Carefully follow your health care provider's instructions and the instructions on the product label. Do not use CHG if you have a chlorhexidine allergy. Contact your health care provider if your skin gets irritated after scrubbing. This information is not intended to replace advice given to you by your health care provider. Make sure you discuss any questions you have with your health care provider. Document Revised: 10/08/2020 Document Reviewed: 10/08/2020 Elsevier Patient Education  2022 [employer].

## 2021-04-22 ENCOUNTER — Encounter (HOSPITAL_COMMUNITY)
Admission: RE | Admit: 2021-04-22 | Discharge: 2021-04-22 | Disposition: A | Discharge status: Home / Self Care | Payer: 59 | Source: Ambulatory Visit | Attending: General Surgery | Admitting: General Surgery

## 2021-04-22 ENCOUNTER — Other Ambulatory Visit: Payer: Self-pay

## 2021-04-22 DIAGNOSIS — Z01818 Encounter for other preprocedural examination: Secondary | ICD-10-CM | POA: Diagnosis not present

## 2021-04-22 LAB — CBC WITH DIFFERENTIAL/PLATELET
Abs Immature Granulocytes: 0.02 K/uL (ref 0.00–0.07)
Basophils Absolute: 0 K/uL (ref 0.0–0.1)
Basophils Relative: 1 %
Eosinophils Absolute: 0.1 K/uL (ref 0.0–0.5)
Eosinophils Relative: 2 %
HCT: 42.6 % (ref 39.0–52.0)
Hemoglobin: 14.1 g/dL (ref 13.0–17.0)
Immature Granulocytes: 0 %
Lymphocytes Relative: 18 %
Lymphs Abs: 1.1 K/uL (ref 0.7–4.0)
MCH: 31.4 pg (ref 26.0–34.0)
MCHC: 33.1 g/dL (ref 30.0–36.0)
MCV: 94.9 fL (ref 80.0–100.0)
Monocytes Absolute: 0.4 K/uL (ref 0.1–1.0)
Monocytes Relative: 7 %
Neutro Abs: 4.4 K/uL (ref 1.7–7.7)
Neutrophils Relative %: 72 %
Platelets: 201 K/uL (ref 150–400)
RBC: 4.49 MIL/uL (ref 4.22–5.81)
RDW: 12.6 % (ref 11.5–15.5)
WBC: 6.1 K/uL (ref 4.0–10.5)
nRBC: 0 % (ref 0.0–0.2)

## 2021-04-24 ENCOUNTER — Ambulatory Visit (HOSPITAL_COMMUNITY)
Admission: RE | Admit: 2021-04-24 | Discharge: 2021-04-24 | Disposition: A | Discharge status: Home / Self Care | Payer: 59 | Attending: General Surgery | Admitting: General Surgery

## 2021-04-24 ENCOUNTER — Ambulatory Visit (HOSPITAL_COMMUNITY): Payer: 59 | Admitting: Anesthesiology

## 2021-04-24 ENCOUNTER — Encounter (HOSPITAL_COMMUNITY)
Admission: RE | Disposition: A | Discharge status: Home / Self Care | Payer: Self-pay | Source: Home / Self Care | Attending: General Surgery

## 2021-04-24 ENCOUNTER — Encounter (HOSPITAL_COMMUNITY): Payer: Self-pay | Admitting: General Surgery

## 2021-04-24 DIAGNOSIS — Z79899 Other long term (current) drug therapy: Secondary | ICD-10-CM | POA: Diagnosis not present

## 2021-04-24 DIAGNOSIS — K4090 Unilateral inguinal hernia, without obstruction or gangrene, not specified as recurrent: Secondary | ICD-10-CM | POA: Diagnosis not present

## 2021-04-24 DIAGNOSIS — K4020 Bilateral inguinal hernia, without obstruction or gangrene, not specified as recurrent: Secondary | ICD-10-CM | POA: Insufficient documentation

## 2021-04-24 HISTORY — PX: INGUINAL HERNIA REPAIR: SHX194

## 2021-04-24 SURGERY — REPAIR, HERNIA, INGUINAL, ADULT
Anesthesia: General | Site: Inguinal | Laterality: Left

## 2021-04-24 MED ORDER — DEXAMETHASONE SODIUM PHOSPHATE 4 MG/ML IJ SOLN
INTRAMUSCULAR | Status: DC | PRN
  Administered 2021-04-24: 5 mg via INTRAVENOUS

## 2021-04-24 MED ORDER — ONDANSETRON HCL 4 MG/2ML IJ SOLN: 4.0000 mg | Freq: Once | INTRAMUSCULAR | Status: DC | PRN

## 2021-04-24 MED ORDER — CHLORHEXIDINE GLUCONATE CLOTH 2 % EX PADS: 6.0000 | MEDICATED_PAD | Freq: Once | CUTANEOUS | Status: DC

## 2021-04-24 MED ORDER — ONDANSETRON HCL 4 MG/2ML IJ SOLN
INTRAMUSCULAR | Status: AC
  Filled 2021-04-24: qty 4

## 2021-04-24 MED ORDER — CHLORHEXIDINE GLUCONATE 0.12 % MT SOLN
15.0000 mL | Freq: Once | OROMUCOSAL | Status: AC
  Administered 2021-04-24: 15 mL via OROMUCOSAL
  Filled 2021-04-24: qty 15

## 2021-04-24 MED ORDER — LIDOCAINE HCL (CARDIAC) PF 100 MG/5ML IV SOSY
PREFILLED_SYRINGE | INTRAVENOUS | Status: DC | PRN
  Administered 2021-04-24: 100 mg via INTRAVENOUS

## 2021-04-24 MED ORDER — PROPOFOL 10 MG/ML IV BOLUS
INTRAVENOUS | Status: DC | PRN
  Administered 2021-04-24: 200 mg via INTRAVENOUS

## 2021-04-24 MED ORDER — LIDOCAINE HCL (PF) 2 % IJ SOLN
INTRAMUSCULAR | Status: AC
  Filled 2021-04-24: qty 10

## 2021-04-24 MED ORDER — HYDROCODONE-ACETAMINOPHEN 10-325 MG PO TABS: 1.0000 | ORAL_TABLET | Freq: Four times a day (QID) | ORAL | 0 refills | Status: DC | PRN

## 2021-04-24 MED ORDER — PROPOFOL 10 MG/ML IV BOLUS
INTRAVENOUS | Status: AC
  Filled 2021-04-24: qty 20

## 2021-04-24 MED ORDER — CEFAZOLIN SODIUM-DEXTROSE 2-4 GM/100ML-% IV SOLN
INTRAVENOUS | Status: AC
  Filled 2021-04-24: qty 100

## 2021-04-24 MED ORDER — LACTATED RINGERS IV SOLN: INTRAVENOUS | Status: DC

## 2021-04-24 MED ORDER — FENTANYL CITRATE (PF) 100 MCG/2ML IJ SOLN
INTRAMUSCULAR | Status: AC
  Filled 2021-04-24: qty 2

## 2021-04-24 MED ORDER — KETOROLAC TROMETHAMINE 30 MG/ML IJ SOLN
30.0000 mg | Freq: Once | INTRAMUSCULAR | Status: AC
  Administered 2021-04-24: 30 mg via INTRAVENOUS
  Filled 2021-04-24: qty 1

## 2021-04-24 MED ORDER — FENTANYL CITRATE (PF) 100 MCG/2ML IJ SOLN
INTRAMUSCULAR | Status: DC | PRN
  Administered 2021-04-24 (×2): 50 ug via INTRAVENOUS

## 2021-04-24 MED ORDER — MIDAZOLAM HCL 5 MG/5ML IJ SOLN
INTRAMUSCULAR | Status: DC | PRN
  Administered 2021-04-24: 2 mg via INTRAVENOUS

## 2021-04-24 MED ORDER — CEFAZOLIN SODIUM-DEXTROSE 2-4 GM/100ML-% IV SOLN
2.0000 g | INTRAVENOUS | Status: AC
  Administered 2021-04-24: 2 g via INTRAVENOUS

## 2021-04-24 MED ORDER — ONDANSETRON HCL 4 MG/2ML IJ SOLN
INTRAMUSCULAR | Status: DC | PRN
  Administered 2021-04-24: 4 mg via INTRAVENOUS

## 2021-04-24 MED ORDER — SODIUM CHLORIDE 0.9 % IR SOLN
Status: DC | PRN
  Administered 2021-04-24: 1000 mL

## 2021-04-24 MED ORDER — MEPERIDINE HCL 50 MG/ML IJ SOLN: 6.2500 mg | INTRAMUSCULAR | Status: DC | PRN

## 2021-04-24 MED ORDER — ORAL CARE MOUTH RINSE: 15.0000 mL | Freq: Once | OROMUCOSAL | Status: AC

## 2021-04-24 MED ORDER — BUPIVACAINE HCL (300 MG DOSE) 3 X 100 MG IL IMPL
DRUG_IMPLANT | Status: DC | PRN
  Administered 2021-04-24: 300 mg

## 2021-04-24 MED ORDER — HYDROMORPHONE HCL 1 MG/ML IJ SOLN: 0.2500 mg | INTRAMUSCULAR | Status: DC | PRN

## 2021-04-24 MED ORDER — MIDAZOLAM HCL 2 MG/2ML IJ SOLN
INTRAMUSCULAR | Status: AC
  Filled 2021-04-24: qty 2

## 2021-04-24 SURGICAL SUPPLY — 30 items
ADH SKN CLS APL DERMABOND .7 (GAUZE/BANDAGES/DRESSINGS) ×1
CLOTH BEACON ORANGE TIMEOUT ST (SAFETY) ×2 IMPLANT
COVER LIGHT HANDLE STERIS (MISCELLANEOUS) ×4 IMPLANT
DERMABOND ADVANCED (GAUZE/BANDAGES/DRESSINGS) ×1
DERMABOND ADVANCED .7 DNX12 (GAUZE/BANDAGES/DRESSINGS) ×1 IMPLANT
DRAIN PENROSE 0.5X18 (DRAIN) ×2 IMPLANT
ELECT REM PT RETURN 9FT ADLT (ELECTROSURGICAL) ×2
ELECTRODE REM PT RTRN 9FT ADLT (ELECTROSURGICAL) ×1 IMPLANT
GAUZE 4X4 16PLY ~~LOC~~+RFID DBL (SPONGE) ×2 IMPLANT
GAUZE SPONGE 4X4 12PLY STRL (GAUZE/BANDAGES/DRESSINGS) ×2 IMPLANT
GLOVE SURG POLYISO LF SZ7.5 (GLOVE) ×2 IMPLANT
GLOVE SURG UNDER POLY LF SZ7 (GLOVE) ×6 IMPLANT
GOWN STRL REUS W/TWL LRG LVL3 (GOWN DISPOSABLE) ×6 IMPLANT
INST SET MINOR GENERAL (KITS) ×2 IMPLANT
KIT TURNOVER KIT A (KITS) ×2 IMPLANT
MANIFOLD NEPTUNE II (INSTRUMENTS) ×2 IMPLANT
MESH HERNIA 1.6X1.9 PLUG LRG (Mesh General) ×1 IMPLANT
MESH HERNIA PLUG LRG (Mesh General) ×1 IMPLANT
NS IRRIG 1000ML POUR BTL (IV SOLUTION) ×2 IMPLANT
PACK MINOR (CUSTOM PROCEDURE TRAY) ×2 IMPLANT
PAD ARMBOARD 7.5X6 YLW CONV (MISCELLANEOUS) ×2 IMPLANT
PENCIL SMOKE EVACUATOR (MISCELLANEOUS) ×2 IMPLANT
SET BASIN LINEN APH (SET/KITS/TRAYS/PACK) ×2 IMPLANT
SOL PREP PROV IODINE SCRUB 4OZ (MISCELLANEOUS) ×2 IMPLANT
SUT MNCRL AB 4-0 PS2 18 (SUTURE) ×2 IMPLANT
SUT NOVA NAB GS-22 2 2-0 T-19 (SUTURE) ×6 IMPLANT
SUT VIC AB 2-0 CT1 27 (SUTURE) ×2
SUT VIC AB 2-0 CT1 TAPERPNT 27 (SUTURE) ×1 IMPLANT
SUT VIC AB 3-0 SH 27 (SUTURE) ×2
SUT VIC AB 3-0 SH 27X BRD (SUTURE) ×1 IMPLANT

## 2021-04-24 NOTE — Anesthesia Postprocedure Evaluation (Signed)
Anesthesia Post Note  Patient: Patrick Moore  Procedure(s) Performed: HERNIA REPAIR INGUINAL ADULT (Left: Inguinal)  Patient location during evaluation: PACU Anesthesia Type: General Level of consciousness: awake and alert and oriented Pain management: pain level controlled Vital Signs Assessment: post-procedure vital signs reviewed and stable Respiratory status: spontaneous breathing and respiratory function stable Cardiovascular status: blood pressure returned to baseline and stable Postop Assessment: no apparent nausea or vomiting Anesthetic complications: no   No notable events documented.   Last Vitals:  Vitals:   04/24/21 1130 04/24/21 1145  BP: (!) 153/102 (!) 166/97  Pulse: 72 74  Resp: 13 16  Temp:  36.8 C  SpO2: 99% 100%    Last Pain:  Vitals:   04/24/21 1145  TempSrc: Oral  PainSc: 2                  Kasidi Shanker C Tashae Inda

## 2021-04-24 NOTE — Interval H&P Note (Signed)
History and Physical Interval Note:  04/24/2021 9:23 AM  Patrick Moore  has presented today for surgery, with the diagnosis of Left inguinal hernia.  The various methods of treatment have been discussed with the patient and family. After consideration of risks, benefits and other options for treatment, the patient has consented to  Procedure(s): HERNIA REPAIR INGUINAL ADULT (Left) as a surgical intervention.  The patient's history has been reviewed, patient examined, no change in status, stable for surgery.  I have reviewed the patient's chart and labs.  Questions were answered to the patient's satisfaction.     Aviva Signs

## 2021-04-24 NOTE — Transfer of Care (Signed)
Immediate Anesthesia Transfer of Care Note  Patient: Patrick Moore  Procedure(s) Performed: HERNIA REPAIR INGUINAL ADULT (Left: Inguinal)  Patient Location: PACU  Anesthesia Type:General  Level of Consciousness: awake, alert  and oriented  Airway & Oxygen Therapy: Patient Spontanous Breathing and Patient connected to nasal cannula oxygen  Post-op Assessment: Report given to RN and Post -op Vital signs reviewed and stable  Post vital signs: Reviewed and stable  Last Vitals:  Vitals Value Taken Time  BP    Temp    Pulse    Resp    SpO2      Last Pain:  Vitals:   04/24/21 0854  TempSrc: Oral  PainSc: 2       Patients Stated Pain Goal: 6 ([id number])  Complications: No notable events documented.

## 2021-04-24 NOTE — Anesthesia Preprocedure Evaluation (Signed)
Anesthesia Evaluation  Patient identified by MRN, date of birth, ID band Patient awake    Reviewed: Allergy & Precautions, NPO status , Patient's Chart, lab work & pertinent test results  History of Anesthesia Complications Negative for: history of anesthetic complications  Airway Mallampati: II  TM Distance: >3 FB Neck ROM: Full    Dental  (+) Dental Advisory Given, Missing   Pulmonary neg pulmonary ROS,    Pulmonary exam normal breath sounds clear to auscultation       Cardiovascular Exercise Tolerance: Good hypertension, Pt. on medications Normal cardiovascular exam Rhythm:Regular Rate:Normal     Neuro/Psych negative neurological ROS  negative psych ROS   GI/Hepatic negative GI ROS, Neg liver ROS,   Endo/Other  negative endocrine ROS  Renal/GU negative Renal ROS     Musculoskeletal  (+) Arthritis  (lumbar disc herniation),   Abdominal   Peds  Hematology negative hematology ROS (+)   Anesthesia Other Findings   Reproductive/Obstetrics negative OB ROS                           Anesthesia Physical Anesthesia Plan  ASA: 2  Anesthesia Plan: General   Post-op Pain Management:    Induction: Intravenous  PONV Risk Score and Plan: 3 and Ondansetron, Dexamethasone and Midazolam  Airway Management Planned: LMA and Oral ETT  Additional Equipment:   Intra-op Plan:   Post-operative Plan: Extubation in OR  Informed Consent: I have reviewed the patients History and Physical, chart, labs and discussed the procedure including the risks, benefits and alternatives for the proposed anesthesia with the patient or authorized representative who has indicated his/her understanding and acceptance.     Dental advisory given  Plan Discussed with: CRNA and Surgeon  Anesthesia Plan Comments:        Anesthesia Quick Evaluation

## 2021-04-24 NOTE — Anesthesia Procedure Notes (Signed)
Procedure Name: LMA Insertion Date/Time: 04/24/2021 10:01 AM Performed by: Riki Sheer, CRNA Pre-anesthesia Checklist: Patient identified, Emergency Drugs available, Suction available, Patient being monitored and Timeout performed Patient Re-evaluated:Patient Re-evaluated prior to induction Oxygen Delivery Method: Circle system utilized Preoxygenation: Pre-oxygenation with 100% oxygen Induction Type: IV induction Ventilation: Mask ventilation without difficulty LMA: LMA inserted LMA Size: 4.0 Number of attempts: 1 Placement Confirmation: positive ETCO2, CO2 detector and breath sounds checked- equal and bilateral Tube secured with: Tape Dental Injury: Teeth and Oropharynx as per pre-operative assessment

## 2021-04-24 NOTE — Op Note (Signed)
Patient:  Patrick Moore  DOB:  02-12-1961  MRN:  [id number]   Preop Diagnosis: Left inguinal hernia  Postop Diagnosis: Same  Procedure: Left inguinal herniorrhaphy with mesh  Surgeon: Aviva Signs, MD  Anes: General  Indications: Patient is a 60 year old white male who presents with a symptomatic left inguinal hernia.  The risks and benefits of the procedure including bleeding, infection, mesh use, the possibility of recurrence of the hernia were fully explained to the patient, who gave informed consent.  Procedure note: The patient was placed in the supine position.  After general anesthesia was administered, the left groin region was prepped and draped using the usual sterile technique with Betadine.  Surgical site confirmation was performed.  Incision was made in the left groin region down to the external oblique aponeurosis.  The aponeurosis was incised to the external ring.  A Penrose drain was placed around the spermatic cord.  The vas deferens was noted within the spermatic cord.  The ilioinguinal nerve was identified and retracted inferiorly from the operative field.  The patient was noted to have a direct hernia.  It was incised along its base and inverted.  A large Bard PerFix plug was then inserted.  An onlay patch was placed along the floor of the inguinal canal and secured superiorly to the conjoined tendon and inferiorly to the shelving edge of Poupart's ligament using a 2-0 Novafil interrupted suture.  The internal ring was recreated using a 2-0 Novafil interrupted suture.  [name] was placed over the mesh and in the subcutaneous tissue.  The external oblique aponeurosis was reapproximated using a 2-0 Vicryl running suture.  Subcutaneous layer was reapproximated using 3-0 Vicryl interrupted suture.  The skin was closed using a 4-0 Monocryl subcuticular suture.  Dermabond was applied.  All tape and needle counts were correct at the end of the procedure.  The patient was awakened  and transferred to PACU in stable condition.  Complications: None  EBL: Minimal  Specimen: None

## 2021-04-25 ENCOUNTER — Encounter (HOSPITAL_COMMUNITY): Payer: Self-pay | Admitting: General Surgery

## 2021-05-02 ENCOUNTER — Telehealth (INDEPENDENT_AMBULATORY_CARE_PROVIDER_SITE_OTHER): Payer: 59 | Admitting: General Surgery

## 2021-05-02 DIAGNOSIS — Z09 Encounter for follow-up examination after completed treatment for conditions other than malignant neoplasm: Secondary | ICD-10-CM

## 2021-05-02 NOTE — Telephone Encounter (Signed)
I initially called the patient and he immediately called me back.  This was a follow-up visit after his hernia surgery.  He states he did grew significantly, but it is resolving.  He is back to work but not lifting anything significantly heavy.  He does feel better than he did last week.  I told him to call me should he have any further questions.  As this was a part of the global surgical fee, this was not a billable visit.  Total telephone time was 2 minutes.

## 2021-05-14 DIAGNOSIS — Z809 Family history of malignant neoplasm, unspecified: Secondary | ICD-10-CM | POA: Diagnosis not present

## 2021-05-14 DIAGNOSIS — R7309 Other abnormal glucose: Secondary | ICD-10-CM | POA: Diagnosis not present

## 2021-05-14 DIAGNOSIS — M1990 Unspecified osteoarthritis, unspecified site: Secondary | ICD-10-CM | POA: Diagnosis not present

## 2021-05-14 DIAGNOSIS — Z86010 Personal history of colonic polyps: Secondary | ICD-10-CM | POA: Diagnosis not present

## 2021-05-14 DIAGNOSIS — I10 Essential (primary) hypertension: Secondary | ICD-10-CM | POA: Diagnosis not present

## 2021-05-14 DIAGNOSIS — E789 Disorder of lipoprotein metabolism, unspecified: Secondary | ICD-10-CM | POA: Diagnosis not present

## 2021-05-14 DIAGNOSIS — Z0001 Encounter for general adult medical examination with abnormal findings: Secondary | ICD-10-CM | POA: Diagnosis not present

## 2021-05-14 DIAGNOSIS — Z23 Encounter for immunization: Secondary | ICD-10-CM | POA: Diagnosis not present

## 2021-05-14 DIAGNOSIS — E785 Hyperlipidemia, unspecified: Secondary | ICD-10-CM | POA: Diagnosis not present

## 2021-05-15 ENCOUNTER — Other Ambulatory Visit (HOSPITAL_COMMUNITY): Payer: Self-pay

## 2021-05-20 ENCOUNTER — Other Ambulatory Visit (HOSPITAL_COMMUNITY): Payer: Self-pay

## 2021-06-04 ENCOUNTER — Other Ambulatory Visit (HOSPITAL_COMMUNITY): Payer: Self-pay

## 2021-06-05 ENCOUNTER — Other Ambulatory Visit (HOSPITAL_COMMUNITY): Payer: Self-pay

## 2021-06-05 MED ORDER — ROSUVASTATIN CALCIUM 40 MG PO TABS
40.0000 mg | ORAL_TABLET | Freq: Every day | ORAL | 1 refills | Status: DC
  Filled 2021-06-05: qty 90, 90d supply, fill #0
  Filled 2021-09-04: qty 90, 90d supply, fill #1

## 2021-06-05 MED ORDER — LOSARTAN POTASSIUM 100 MG PO TABS
100.0000 mg | ORAL_TABLET | Freq: Every day | ORAL | 1 refills | Status: DC
  Filled 2021-06-05: qty 90, 90d supply, fill #0
  Filled 2021-09-04: qty 90, 90d supply, fill #1

## 2021-06-21 ENCOUNTER — Other Ambulatory Visit (HOSPITAL_BASED_OUTPATIENT_CLINIC_OR_DEPARTMENT_OTHER): Payer: Self-pay

## 2021-09-04 ENCOUNTER — Other Ambulatory Visit (HOSPITAL_COMMUNITY): Payer: Self-pay

## 2021-09-05 ENCOUNTER — Other Ambulatory Visit (HOSPITAL_COMMUNITY): Payer: Self-pay

## 2021-10-30 ENCOUNTER — Other Ambulatory Visit (HOSPITAL_COMMUNITY): Payer: Self-pay

## 2021-10-30 MED ORDER — SILDENAFIL CITRATE 100 MG PO TABS
100.0000 mg | ORAL_TABLET | Freq: Every day | ORAL | 3 refills | Status: DC | PRN
  Filled 2021-10-30: qty 6, 30d supply, fill #0
  Filled 2022-02-25: qty 6, 30d supply, fill #1
  Filled 2022-04-09: qty 6, 30d supply, fill #2
  Filled 2022-06-03: qty 6, 30d supply, fill #3
  Filled 2022-10-21: qty 6, 30d supply, fill #4

## 2021-11-05 ENCOUNTER — Other Ambulatory Visit (HOSPITAL_COMMUNITY): Payer: Self-pay

## 2021-12-03 ENCOUNTER — Other Ambulatory Visit (HOSPITAL_COMMUNITY): Payer: Self-pay

## 2021-12-04 ENCOUNTER — Other Ambulatory Visit (HOSPITAL_COMMUNITY): Payer: Self-pay

## 2021-12-04 MED ORDER — ROSUVASTATIN CALCIUM 40 MG PO TABS
40.0000 mg | ORAL_TABLET | Freq: Every day | ORAL | 1 refills | Status: DC
  Filled 2021-12-04: qty 90, 90d supply, fill #0
  Filled 2022-02-25: qty 90, 90d supply, fill #1

## 2021-12-04 MED ORDER — LOSARTAN POTASSIUM 100 MG PO TABS
100.0000 mg | ORAL_TABLET | Freq: Every day | ORAL | 1 refills | Status: DC
  Filled 2021-12-04: qty 90, 90d supply, fill #0
  Filled 2022-02-25: qty 90, 90d supply, fill #1

## 2022-02-26 ENCOUNTER — Other Ambulatory Visit (HOSPITAL_COMMUNITY): Payer: Self-pay

## 2022-04-09 ENCOUNTER — Other Ambulatory Visit (HOSPITAL_COMMUNITY): Payer: Self-pay

## 2022-05-15 ENCOUNTER — Other Ambulatory Visit (HOSPITAL_COMMUNITY): Payer: Self-pay

## 2022-05-15 DIAGNOSIS — E785 Hyperlipidemia, unspecified: Secondary | ICD-10-CM | POA: Diagnosis not present

## 2022-05-15 DIAGNOSIS — E119 Type 2 diabetes mellitus without complications: Secondary | ICD-10-CM | POA: Diagnosis not present

## 2022-05-15 DIAGNOSIS — E663 Overweight: Secondary | ICD-10-CM | POA: Diagnosis not present

## 2022-05-15 DIAGNOSIS — Z809 Family history of malignant neoplasm, unspecified: Secondary | ICD-10-CM | POA: Diagnosis not present

## 2022-05-15 DIAGNOSIS — Z86010 Personal history of colonic polyps: Secondary | ICD-10-CM | POA: Diagnosis not present

## 2022-05-15 DIAGNOSIS — Z0001 Encounter for general adult medical examination with abnormal findings: Secondary | ICD-10-CM | POA: Diagnosis not present

## 2022-05-15 DIAGNOSIS — Z1331 Encounter for screening for depression: Secondary | ICD-10-CM | POA: Diagnosis not present

## 2022-05-15 DIAGNOSIS — I10 Essential (primary) hypertension: Secondary | ICD-10-CM | POA: Diagnosis not present

## 2022-05-15 DIAGNOSIS — Z23 Encounter for immunization: Secondary | ICD-10-CM | POA: Diagnosis not present

## 2022-05-15 DIAGNOSIS — M1990 Unspecified osteoarthritis, unspecified site: Secondary | ICD-10-CM | POA: Diagnosis not present

## 2022-05-15 DIAGNOSIS — Z6828 Body mass index (BMI) 28.0-28.9, adult: Secondary | ICD-10-CM | POA: Diagnosis not present

## 2022-05-15 MED ORDER — LOSARTAN POTASSIUM 100 MG PO TABS
100.0000 mg | ORAL_TABLET | Freq: Every day | ORAL | 1 refills | Status: DC
  Filled 2022-05-15: qty 90, 90d supply, fill #0
  Filled 2022-08-13: qty 90, 90d supply, fill #1

## 2022-05-15 MED ORDER — ROSUVASTATIN CALCIUM 40 MG PO TABS
40.0000 mg | ORAL_TABLET | Freq: Every day | ORAL | 1 refills | Status: DC
  Filled 2022-05-15: qty 90, 90d supply, fill #0
  Filled 2022-08-13: qty 90, 90d supply, fill #1

## 2022-06-04 ENCOUNTER — Other Ambulatory Visit (HOSPITAL_COMMUNITY): Payer: Self-pay

## 2022-07-31 DIAGNOSIS — H52203 Unspecified astigmatism, bilateral: Secondary | ICD-10-CM | POA: Diagnosis not present

## 2022-08-14 ENCOUNTER — Other Ambulatory Visit (HOSPITAL_COMMUNITY): Payer: Self-pay

## 2022-09-05 DIAGNOSIS — H2513 Age-related nuclear cataract, bilateral: Secondary | ICD-10-CM | POA: Diagnosis not present

## 2022-09-17 DIAGNOSIS — H269 Unspecified cataract: Secondary | ICD-10-CM | POA: Diagnosis not present

## 2022-09-17 DIAGNOSIS — H268 Other specified cataract: Secondary | ICD-10-CM | POA: Diagnosis not present

## 2022-09-17 DIAGNOSIS — H2512 Age-related nuclear cataract, left eye: Secondary | ICD-10-CM | POA: Diagnosis not present

## 2022-09-17 DIAGNOSIS — H278 Other specified disorders of lens: Secondary | ICD-10-CM | POA: Diagnosis not present

## 2022-11-10 ENCOUNTER — Other Ambulatory Visit (HOSPITAL_COMMUNITY): Payer: Self-pay

## 2022-11-11 ENCOUNTER — Other Ambulatory Visit (HOSPITAL_COMMUNITY): Payer: Self-pay

## 2022-11-11 MED ORDER — LOSARTAN POTASSIUM 100 MG PO TABS
100.0000 mg | ORAL_TABLET | Freq: Every day | ORAL | 1 refills | Status: DC
  Filled 2022-11-11: qty 90, 90d supply, fill #0
  Filled 2023-02-04: qty 90, 90d supply, fill #1

## 2022-11-11 MED ORDER — ROSUVASTATIN CALCIUM 40 MG PO TABS
40.0000 mg | ORAL_TABLET | Freq: Every day | ORAL | 1 refills | Status: DC
  Filled 2022-11-11: qty 90, 90d supply, fill #0
  Filled 2023-02-04: qty 90, 90d supply, fill #1

## 2022-12-01 ENCOUNTER — Other Ambulatory Visit (HOSPITAL_COMMUNITY): Payer: Self-pay

## 2022-12-02 ENCOUNTER — Other Ambulatory Visit (HOSPITAL_COMMUNITY): Payer: Self-pay

## 2022-12-02 MED ORDER — SILDENAFIL CITRATE 100 MG PO TABS
100.0000 mg | ORAL_TABLET | Freq: Every day | ORAL | 3 refills | Status: DC | PRN
  Filled 2022-12-02: qty 6, 30d supply, fill #0
  Filled 2023-01-19: qty 6, 30d supply, fill #1
  Filled 2023-04-08: qty 6, 30d supply, fill #2
  Filled 2023-05-26: qty 6, 30d supply, fill #3
  Filled 2023-07-22: qty 6, 30d supply, fill #4
  Filled 2023-10-18: qty 6, 30d supply, fill #5

## 2023-02-04 ENCOUNTER — Other Ambulatory Visit (HOSPITAL_COMMUNITY): Payer: Self-pay

## 2023-04-27 ENCOUNTER — Other Ambulatory Visit (HOSPITAL_COMMUNITY): Payer: Self-pay

## 2023-04-28 ENCOUNTER — Other Ambulatory Visit (HOSPITAL_COMMUNITY): Payer: Self-pay

## 2023-04-28 MED ORDER — LOSARTAN POTASSIUM 100 MG PO TABS
100.0000 mg | ORAL_TABLET | Freq: Every day | ORAL | 0 refills | Status: DC
  Filled 2023-04-28: qty 90, 90d supply, fill #0

## 2023-04-28 MED ORDER — ROSUVASTATIN CALCIUM 40 MG PO TABS
40.0000 mg | ORAL_TABLET | Freq: Every day | ORAL | 0 refills | Status: DC
  Filled 2023-04-28: qty 90, 90d supply, fill #0

## 2023-05-26 DIAGNOSIS — Z961 Presence of intraocular lens: Secondary | ICD-10-CM | POA: Diagnosis not present

## 2023-06-03 ENCOUNTER — Other Ambulatory Visit (HOSPITAL_COMMUNITY): Payer: Self-pay

## 2023-06-03 DIAGNOSIS — E119 Type 2 diabetes mellitus without complications: Secondary | ICD-10-CM | POA: Diagnosis not present

## 2023-06-03 DIAGNOSIS — E785 Hyperlipidemia, unspecified: Secondary | ICD-10-CM | POA: Diagnosis not present

## 2023-06-03 DIAGNOSIS — Z0001 Encounter for general adult medical examination with abnormal findings: Secondary | ICD-10-CM | POA: Diagnosis not present

## 2023-06-03 MED ORDER — SILDENAFIL CITRATE 100 MG PO TABS: 100.0000 mg | ORAL_TABLET | Freq: Every day | ORAL | 11 refills | Status: AC | PRN

## 2023-06-03 MED ORDER — LOSARTAN POTASSIUM 100 MG PO TABS
100.0000 mg | ORAL_TABLET | Freq: Every day | ORAL | 3 refills | Status: DC
  Filled 2023-06-03 – 2023-07-22 (×2): qty 90, 90d supply, fill #0
  Filled 2023-10-18: qty 90, 90d supply, fill #1
  Filled 2024-01-17: qty 90, 90d supply, fill #2
  Filled 2024-04-18: qty 90, 90d supply, fill #3

## 2023-06-03 MED ORDER — ROSUVASTATIN CALCIUM 40 MG PO TABS
40.0000 mg | ORAL_TABLET | Freq: Every day | ORAL | 3 refills | Status: DC
  Filled 2023-07-22: qty 90, 90d supply, fill #0
  Filled 2023-10-18: qty 90, 90d supply, fill #1
  Filled 2024-01-17: qty 90, 90d supply, fill #2
  Filled 2024-04-18: qty 90, 90d supply, fill #3

## 2023-07-22 ENCOUNTER — Other Ambulatory Visit (HOSPITAL_COMMUNITY): Payer: Self-pay

## 2023-12-23 ENCOUNTER — Encounter: Payer: Self-pay | Admitting: *Deleted

## 2024-01-17 ENCOUNTER — Other Ambulatory Visit (HOSPITAL_COMMUNITY): Payer: Self-pay

## 2024-01-18 ENCOUNTER — Other Ambulatory Visit (HOSPITAL_COMMUNITY): Payer: Self-pay

## 2024-01-18 MED ORDER — SILDENAFIL CITRATE 100 MG PO TABS
100.0000 mg | ORAL_TABLET | Freq: Every day | ORAL | 3 refills | Status: DC | PRN
  Filled 2024-01-18: qty 30, 150d supply, fill #0

## 2024-03-28 ENCOUNTER — Encounter (HOSPITAL_COMMUNITY): Payer: Self-pay | Admitting: Emergency Medicine

## 2024-03-28 ENCOUNTER — Emergency Department (HOSPITAL_COMMUNITY)
Admission: EM | Admit: 2024-03-28 | Discharge: 2024-03-28 | Disposition: A | Discharge status: Home / Self Care | Payer: Worker's Compensation | Attending: Emergency Medicine | Admitting: Emergency Medicine

## 2024-03-28 ENCOUNTER — Other Ambulatory Visit: Payer: Self-pay

## 2024-03-28 ENCOUNTER — Emergency Department (HOSPITAL_COMMUNITY): Payer: Worker's Compensation

## 2024-03-28 DIAGNOSIS — S0990XA Unspecified injury of head, initial encounter: Secondary | ICD-10-CM | POA: Diagnosis present

## 2024-03-28 DIAGNOSIS — Z23 Encounter for immunization: Secondary | ICD-10-CM | POA: Diagnosis not present

## 2024-03-28 DIAGNOSIS — S0101XA Laceration without foreign body of scalp, initial encounter: Secondary | ICD-10-CM | POA: Diagnosis not present

## 2024-03-28 DIAGNOSIS — W228XXA Striking against or struck by other objects, initial encounter: Secondary | ICD-10-CM | POA: Insufficient documentation

## 2024-03-28 MED ORDER — ACETAMINOPHEN 500 MG PO TABS
1000.0000 mg | ORAL_TABLET | Freq: Once | ORAL | Status: AC
  Administered 2024-03-28: 1000 mg via ORAL
  Filled 2024-03-28: qty 2

## 2024-03-28 MED ORDER — TETANUS-DIPHTH-ACELL PERTUSSIS 5-2.5-18.5 LF-MCG/0.5 IM SUSY
0.5000 mL | PREFILLED_SYRINGE | Freq: Once | INTRAMUSCULAR | Status: AC
  Administered 2024-03-28: 0.5 mL via INTRAMUSCULAR
  Filled 2024-03-28: qty 0.5
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
## 2024-03-28 NOTE — ED Notes (Signed)
 Wound care provided.

## 2024-03-28 NOTE — ED Notes (Signed)
 Pt stated his employer did NOT require anything from the hospital for their workman's comp claim.

## 2024-03-28 NOTE — Discharge Instructions (Signed)
 As discussed, your evaluation today has been largely reassuring.  But, it is important that you monitor your condition carefully, and do not hesitate to return to the ED if you develop new, or concerning changes in your condition. ? ?Otherwise, please follow-up with your physician for appropriate ongoing care. ? ?

## 2024-03-28 NOTE — ED Notes (Signed)
MD applied dermabond to wound
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
## 2024-04-19 ENCOUNTER — Other Ambulatory Visit (HOSPITAL_COMMUNITY): Payer: Self-pay

## 2024-05-26 DIAGNOSIS — H524 Presbyopia: Secondary | ICD-10-CM | POA: Diagnosis not present

## 2024-06-06 DIAGNOSIS — E785 Hyperlipidemia, unspecified: Secondary | ICD-10-CM | POA: Diagnosis not present

## 2024-06-06 DIAGNOSIS — R7309 Other abnormal glucose: Secondary | ICD-10-CM | POA: Diagnosis not present

## 2024-06-06 DIAGNOSIS — Z125 Encounter for screening for malignant neoplasm of prostate: Secondary | ICD-10-CM | POA: Diagnosis not present

## 2024-06-06 DIAGNOSIS — H6120 Impacted cerumen, unspecified ear: Secondary | ICD-10-CM | POA: Diagnosis not present

## 2024-06-06 DIAGNOSIS — I10 Essential (primary) hypertension: Secondary | ICD-10-CM | POA: Diagnosis not present

## 2024-06-06 DIAGNOSIS — Z23 Encounter for immunization: Secondary | ICD-10-CM | POA: Diagnosis not present

## 2024-06-06 DIAGNOSIS — Z0000 Encounter for general adult medical examination without abnormal findings: Secondary | ICD-10-CM | POA: Diagnosis not present

## 2024-06-06 DIAGNOSIS — Z860100 Personal history of colon polyps, unspecified: Secondary | ICD-10-CM | POA: Diagnosis not present

## 2024-06-08 ENCOUNTER — Encounter (INDEPENDENT_AMBULATORY_CARE_PROVIDER_SITE_OTHER): Payer: Self-pay | Admitting: *Deleted

## 2024-06-30 ENCOUNTER — Telehealth: Payer: Self-pay

## 2024-06-30 NOTE — Telephone Encounter (Signed)
 Who is your primary care physician: Dr.Jay Marvine  Reasons for the colonoscopy: Hx polyps  Have you had a colonoscopy before?  yes  Do you have family history of colon cancer? Yes sister  Previous colonoscopy with polyps removed? yes  Do you have a history colorectal cancer?   no  Are you diabetic? If yes, Type 1 or Type 2?    no  Do you have a prosthetic or mechanical heart valve? no  Do you have a pacemaker/defibrillator?   no  Have you had endocarditis/atrial fibrillation? no  Have you had joint replacement within the last 12 months?  no  Do you tend to be constipated or have to use laxatives? no  Do you have any history of drugs or alchohol?  Yes 15-18 drinks weekly  Do you use supplemental oxygen?  no  Have you had a stroke or heart attack within the last 6 months? no  Do you take weight loss medication?  no    Do you take any blood-thinning medications such as: (aspirin, warfarin, Plavix, Aggrenox)  yes   If yes we need the name, milligram, dosage and who is prescribing doctor ASA 325 mg Current Outpatient Medications on File Prior to Visit  Medication Sig Dispense Refill   aspirin 325 MG tablet Take 325 mg by mouth daily.     diphenhydrAMINE HCl, Sleep, (NIGHT TIME SLEEP AID PO) Take by mouth.     folic acid-vitamin b complex-vitamin c-selenium-zinc (DIALYVITE) 3 MG TABS tablet Take 1 tablet by mouth daily.     losartan  (COZAAR ) 100 MG tablet Take 1 tablet (100 mg total) by mouth daily. 90 tablet 3   Omega-3 Fatty Acids (FISH OIL) 1200 MG CAPS Take 1,200 mg by mouth daily.     OVER THE COUNTER MEDICATION Take 1 Scoop by mouth daily. Dismuzyme     rosuvastatin  (CRESTOR ) 40 MG tablet Take 1 tablet (40 mg total) by mouth daily. 90 tablet 3   sildenafil  (VIAGRA ) 100 MG tablet Take 1 tablet (100 mg total) by mouth daily as needed. 30 tablet 11   Turmeric (QC TUMERIC COMPLEX PO) Take by mouth.     No current facility-administered medications on file prior to visit.     No Known Allergies   Pharmacy: [facility] [place]  Primary Insurance Name: [employer] [id number]  Best number where you can be reached: [phone]

## 2024-07-13 ENCOUNTER — Other Ambulatory Visit (HOSPITAL_COMMUNITY): Payer: Self-pay

## 2024-07-14 ENCOUNTER — Other Ambulatory Visit (HOSPITAL_COMMUNITY): Payer: Self-pay

## 2024-07-14 MED ORDER — LOSARTAN POTASSIUM 100 MG PO TABS
100.0000 mg | ORAL_TABLET | Freq: Every day | ORAL | 3 refills | Status: AC
  Filled 2024-07-14: qty 90, 90d supply, fill #0

## 2024-07-14 MED ORDER — ROSUVASTATIN CALCIUM 40 MG PO TABS
40.0000 mg | ORAL_TABLET | Freq: Every day | ORAL | 3 refills | Status: AC
  Filled 2024-07-14: qty 90, 90d supply, fill #0

## 2024-07-18 ENCOUNTER — Other Ambulatory Visit (HOSPITAL_COMMUNITY): Payer: Self-pay

## 2024-07-21 NOTE — Telephone Encounter (Signed)
 Hx of adenomas in 2020. ASA 3.

## 2024-07-22 ENCOUNTER — Other Ambulatory Visit (HOSPITAL_COMMUNITY): Payer: Self-pay

## 2024-07-22 MED ORDER — PEG 3350-KCL-NA BICARB-NACL 420 G PO SOLR
4000.0000 mL | Freq: Once | ORAL | 0 refills | Status: AC
  Filled 2024-07-22: qty 4000, 1d supply, fill #0

## 2024-07-22 NOTE — Telephone Encounter (Signed)
 Spoke with pt. He has been scheduled with Dr. Shaaron 1/7. Aware will mail instructions and rx for prep to be sent to pharmacy.

## 2024-07-22 NOTE — Addendum Note (Signed)
 Addended by: JEANELL GRAEME RAMAN on: 07/22/2024 10:00 AM   Modules accepted: Orders

## 2024-07-26 ENCOUNTER — Encounter (INDEPENDENT_AMBULATORY_CARE_PROVIDER_SITE_OTHER): Payer: Self-pay | Admitting: *Deleted

## 2024-07-26 NOTE — Telephone Encounter (Signed)
 Referral completed, [facility] apt letter sent to PCP

## 2024-08-12 ENCOUNTER — Encounter (HOSPITAL_COMMUNITY)
Admission: RE | Admit: 2024-08-12 | Discharge: 2024-08-12 | Disposition: A | Discharge status: Home / Self Care | Source: Ambulatory Visit | Attending: Internal Medicine | Admitting: Internal Medicine

## 2024-08-12 NOTE — Pre-Procedure Instructions (Signed)
 Attempted pre-op phone call. Left VM for him to call us back.

## 2024-08-17 ENCOUNTER — Encounter (HOSPITAL_COMMUNITY): Payer: Self-pay | Admitting: Internal Medicine

## 2024-08-17 ENCOUNTER — Ambulatory Visit (HOSPITAL_COMMUNITY): Admitting: Anesthesiology

## 2024-08-17 ENCOUNTER — Encounter (HOSPITAL_COMMUNITY): Admission: RE | Disposition: A | Payer: Self-pay | Source: Home / Self Care | Attending: Internal Medicine

## 2024-08-17 ENCOUNTER — Ambulatory Visit (HOSPITAL_COMMUNITY)
Admission: RE | Admit: 2024-08-17 | Discharge: 2024-08-17 | Disposition: A | Discharge status: Home / Self Care | Attending: Internal Medicine | Admitting: Internal Medicine

## 2024-08-17 DIAGNOSIS — Z860100 Personal history of colon polyps, unspecified: Secondary | ICD-10-CM | POA: Diagnosis not present

## 2024-08-17 DIAGNOSIS — K5730 Diverticulosis of large intestine without perforation or abscess without bleeding: Secondary | ICD-10-CM | POA: Insufficient documentation

## 2024-08-17 DIAGNOSIS — K640 First degree hemorrhoids: Secondary | ICD-10-CM | POA: Diagnosis not present

## 2024-08-17 DIAGNOSIS — I10 Essential (primary) hypertension: Secondary | ICD-10-CM | POA: Insufficient documentation

## 2024-08-17 DIAGNOSIS — Z1211 Encounter for screening for malignant neoplasm of colon: Secondary | ICD-10-CM | POA: Diagnosis present

## 2024-08-17 HISTORY — PX: COLONOSCOPY: SHX5424

## 2024-08-17 MED ORDER — LACTATED RINGERS IV SOLN: INTRAVENOUS | Status: DC

## 2024-08-17 MED ORDER — PROPOFOL 10 MG/ML IV BOLUS
INTRAVENOUS | Status: DC | PRN
  Administered 2024-08-17: 75 mg via INTRAVENOUS
  Administered 2024-08-17: 50 mg via INTRAVENOUS

## 2024-08-17 MED ORDER — LIDOCAINE 2% (20 MG/ML) 5 ML SYRINGE
INTRAMUSCULAR | Status: DC | PRN
  Administered 2024-08-17: 50 mg via INTRAVENOUS

## 2024-08-17 MED ORDER — PROPOFOL 500 MG/50ML IV EMUL
INTRAVENOUS | Status: DC | PRN
  Administered 2024-08-17: 150 ug/kg/min via INTRAVENOUS

## 2024-08-17 NOTE — Op Note (Signed)
 [facility] Patient Name: Patrick Moore Procedure Date: 08/17/2024 9:00 AM MRN: [id number] Date of Birth: Sep 12, 1960 Attending MD: Patrick Moore , MD, [id number] CSN: [id number] Age: 64 Admit Type: Outpatient Procedure:                Colonoscopy Indications:              High risk colon cancer surveillance: Personal                            history of colonic polyps Providers:                Patrick Ozell Hollingshead, MD, Madelin Hunter, RN, Chad                            Wilson, Technician Referring MD:              Medicines:                Propofol  per Anesthesia Complications:            No immediate complications. Estimated Blood Loss:     Estimated blood loss: none. Procedure:                Pre-Anesthesia Assessment:                           - Prior to the procedure, a History and Physical                            was performed, and patient medications and                            allergies were reviewed. The patient's tolerance of                            previous anesthesia was also reviewed. The risks                            and benefits of the procedure and the sedation                            options and risks were discussed with the patient.                            All questions were answered, and informed consent                            was obtained. Prior Anticoagulants: The patient has                            taken no anticoagulant or antiplatelet agents. ASA                            Grade Assessment: III - A patient with severe  systemic disease. After reviewing the risks and                            benefits, the patient was deemed in satisfactory                            condition to undergo the procedure.                           After obtaining informed consent, the colonoscope                            was passed under direct vision. Throughout the                            procedure, the patient's  blood pressure, pulse, and                            oxygen saturations were monitored continuously. The                            [id number] ([id number]) Colon was introduced through                            the anus and advanced to the the cecum, identified                            by appendiceal orifice and ileocecal valve. The                            colonoscopy was performed without difficulty. The                            patient tolerated the procedure well. The quality                            of the bowel preparation was adequate. The                            ileocecal valve, appendiceal orifice, and rectum                            were photographed. Scope In: 9:33:33 AM Scope Out: 9:44:35 AM Scope Withdrawal Time: 0 hours 6 minutes 29 seconds  Total Procedure Duration: 0 hours 11 minutes 2 seconds  Findings:      The perianal and digital rectal examinations were normal.      Non-bleeding internal hemorrhoids were found during retroflexion. The       hemorrhoids were mild, small and Grade I (internal hemorrhoids that do       not prolapse).      Scattered medium-mouthed diverticula were found in the sigmoid colon and       descending colon.      The exam was otherwise without abnormality on direct and retroflexion       views. Impression:               -  Non-bleeding internal hemorrhoids.                           - Diverticulosis in the sigmoid colon and in the                            descending colon.                           - The examination was otherwise normal on direct                            and retroflexion views.                           - No specimens collected. Moderate Sedation:      Moderate (conscious) sedation was personally administered by an       anesthesia professional. The following parameters were monitored: oxygen       saturation, heart rate, blood pressure, respiratory rate, EKG, adequacy       of pulmonary ventilation, and  response to care. Recommendation:           - Patient has a contact number available for                            emergencies. The signs and symptoms of potential                            delayed complications were discussed with the                            patient. Return to normal activities tomorrow.                            Written discharge instructions were provided to the                            patient.                           - Advance diet as tolerated.                           - Continue present medications.                           - Repeat colonoscopy in 7 years for surveillance.                           - Return to GI office (date not yet determined). Procedure Code(s):        --- Professional ---                           [phone], Colonoscopy, flexible; diagnostic, including  collection of specimen(s) by brushing or washing,                            when performed (separate procedure) Diagnosis Code(s):        --- Professional ---                           Z86.010, Personal history of colonic polyps                           K64.0, First degree hemorrhoids                           K57.30, Diverticulosis of large intestine without                            perforation or abscess without bleeding CPT copyright 2022 American Medical Association. All rights reserved. The codes documented in this report are preliminary and upon coder review may  be revised to meet current compliance requirements. Patrick Moore. Patrick Costabile, MD Patrick Ozell Hollingshead, MD 08/17/2024 9:54:01 AM This report has been signed electronically. Number of Addenda: 0

## 2024-08-17 NOTE — Discharge Instructions (Addendum)
" °  Colonoscopy Discharge Instructions  Read the instructions outlined below and refer to this sheet in the next few weeks. These discharge instructions provide you with general information on caring for yourself after you leave the hospital. Your doctor may also give you specific instructions. While your treatment has been planned according to the most current medical practices available, unavoidable complications occasionally occur. If you have any problems or questions after discharge, call Dr. Shaaron at [phone]. ACTIVITY You may resume your regular activity, but move at a slower pace for the next 24 hours.  Take frequent rest periods for the next 24 hours.  Walking will help get rid of the air and reduce the bloated feeling in your belly (abdomen).  No driving for 24 hours (because of the medicine (anesthesia) used during the test).   Do not sign any important legal documents or operate any machinery for 24 hours (because of the anesthesia used during the test).  NUTRITION Drink plenty of fluids.  You may resume your normal diet as instructed by your doctor.  Begin with a light meal and progress to your normal diet. Heavy or fried foods are harder to digest and may make you feel sick to your stomach (nauseated).  Avoid alcoholic beverages for 24 hours or as instructed.  MEDICATIONS You may resume your normal medications unless your doctor tells you otherwise.  WHAT YOU CAN EXPECT TODAY Some feelings of bloating in the abdomen.  Passage of more gas than usual.  Spotting of blood in your stool or on the toilet paper.  IF YOU HAD POLYPS REMOVED DURING THE COLONOSCOPY: No aspirin products for 7 days or as instructed.  No alcohol for 7 days or as instructed.  Eat a soft diet for the next 24 hours.  FINDING OUT THE RESULTS OF YOUR TEST Not all test results are available during your visit. If your test results are not back during the visit, make an appointment with your caregiver to find out the  results. Do not assume everything is normal if you have not heard from your caregiver or the medical facility. It is important for you to follow up on all of your test results.  SEEK IMMEDIATE MEDICAL ATTENTION IF: You have more than a spotting of blood in your stool.  Your belly is swollen (abdominal distention).  You are nauseated or vomiting.  You have a temperature over 101.  You have abdominal pain or discomfort that is severe or gets worse throughout the day.      diverticulosis present -unchanged  no polyps.    Recommend repeat colonoscopy in 7 years.  The patient request, I called Leita at [phone] findings and recommend [facility] "

## 2024-08-17 NOTE — Transfer of Care (Signed)
 Immediate Anesthesia Transfer of Care Note  Patient: Patrick Moore  Procedure(s) Performed: COLONOSCOPY  Patient Location: Short Stay  Anesthesia Type:General  Level of Consciousness: awake  Airway & Oxygen Therapy: Patient Spontanous Breathing  Post-op Assessment: Report given to RN  Post vital signs: Reviewed and stable  Last Vitals:  Vitals Value Taken Time  BP    Temp    Pulse    Resp    SpO2      Last Pain:  Vitals:   08/17/24 0929  PainSc: 4       Patients Stated Pain Goal: 3 (08/17/24 0756)  Complications: No notable events documented.

## 2024-08-17 NOTE — H&P (Signed)
 @LOGO @   Gastroenterology Progress Note    Primary Care Physician:  Marvine Rush, MD Primary Gastroenterologist:  Dr. Shaaron  Pre-Procedure History & Physical: HPI:  Patrick Moore is a 64 y.o. male here for  surveillance colonoscopy.  History of multiple colonic polyps removed over time.  Past Medical History:  Diagnosis Date   Hypercholesteremia    Hypertension    Lumbar disc herniation    Plantar fasciitis     Past Surgical History:  Procedure Laterality Date   CARDIAC CATHETERIZATION     COLONOSCOPY N/A 07/26/2014   Procedure: COLONOSCOPY;  Surgeon: Lamar CHRISTELLA Shaaron, MD;  Location: [facility];  Service: Endoscopy;  Laterality: N/A;  7:30 AM   COLONOSCOPY N/A 01/05/2019   Procedure: COLONOSCOPY;  Surgeon: Shaaron Lamar CHRISTELLA, MD;  Location: [facility];  Service: Endoscopy;  Laterality: N/A;  10:30am   INGUINAL HERNIA REPAIR Left 04/24/2021   Procedure: HERNIA REPAIR INGUINAL ADULT;  Surgeon: Mavis Anes, MD;  Location: [facility];  Service: General;  Laterality: Left;   INGUINAL LYMPH NODE BIOPSY     as a child   POLYPECTOMY  01/05/2019   Procedure: POLYPECTOMY;  Surgeon: Shaaron Lamar CHRISTELLA, MD;  Location: [facility];  Service: Endoscopy;;    Prior to Admission medications  Medication Sig Start Date End Date Taking? Authorizing Provider  aspirin 325 MG tablet Take 325 mg by mouth daily.   Yes [provider]  diphenhydrAMINE HCl, Sleep, (NIGHT TIME SLEEP AID PO) Take by mouth.   Yes [provider]  folic acid-vitamin b complex-vitamin c-selenium-zinc (DIALYVITE) 3 MG TABS tablet Take 1 tablet by mouth daily.   Yes [provider]  losartan  (COZAAR ) 100 MG tablet Take 1 tablet (100 mg total) by mouth daily. 07/14/24  Yes   Omega-3 Fatty Acids (FISH OIL) 1200 MG CAPS Take 1,200 mg by mouth daily.   Yes [provider]  OVER THE COUNTER MEDICATION Take 1 Scoop by mouth daily. Dismuzyme   Yes [provider]  Turmeric (QC TUMERIC  COMPLEX PO) Take by mouth.   Yes [provider]  rosuvastatin  (CRESTOR ) 40 MG tablet Take 1 tablet (40 mg total) by mouth daily. 07/14/24     sildenafil  (VIAGRA ) 100 MG tablet Take 1 tablet (100 mg total) by mouth daily as needed. 06/03/23       Allergies as of 07/22/2024   (No Known Allergies)    Family History  Problem Relation Age of Onset   Colon cancer Sister 43       passed 4 months after diagnosis    Social History   Socioeconomic History   Marital status: Married    Spouse name: Not on file   Number of children: Not on file   Years of education: Not on file   Highest education level: Not on file  Occupational History   Not on file  Tobacco Use   Smoking status: Never   Smokeless tobacco: Never  Substance and Sexual Activity   Alcohol use: Yes    Comment: 12 pack weekly   Drug use: No   Sexual activity: Not on file  Other Topics Concern   Not on file  Social History Narrative   Not on file   Social Drivers of Health   Tobacco Use: Low Risk (08/17/2024)   Patient History    Smoking Tobacco Use: Never    Smokeless Tobacco Use: Never    Passive Exposure: Not on file  Financial Resource Strain:  Not on file  Food Insecurity: Not on file  Transportation Needs: Not on file  Physical Activity: Not on file  Stress: Not on file  Social Connections: Not on file  Intimate Partner Violence: Not on file  Depression (PHQ2-9): Not on file  Alcohol Screen: Not on file  Housing: Not on file  Utilities: Not on file  Health Literacy: Not on file    Review of Systems   See HPI, otherwise negative ROS  Physical Exam: BP (!) 161/97 (BP Location: Right Arm)   Temp 98.4 F (36.9 C)   Resp 14   SpO2 100%  General:   Alert,  Well-developed, well-nourished, pleasant and cooperative in NAD Neck:  Supple; no masses or thyromegaly. No significant cervical adenopathy. Lungs:  Clear throughout to auscultation.   No wheezes, crackles, or rhonchi. No acute  distress. Heart:  Regular rate and rhythm; no murmurs, clicks, rubs,  or gallops. Abdomen: Non-distended, normal bowel sounds.  Soft and nontender without appreciable mass or hepatosplenomegaly.   Impression/Plan:     64 year old gentleman with a history of colonic polyps removed over time here for surveillance colonoscopy per plan.  The risks, benefits, limitations, alternatives and imponderables have been reviewed with the patient. Questions have been answered. All parties are agreeable.      Notice: This dictation was prepared with Dragon dictation along with smaller phrase technology. Any transcriptional errors that result from this process are unintentional and may not be corrected upon review.

## 2024-08-17 NOTE — Anesthesia Postprocedure Evaluation (Signed)
"   Anesthesia Post Note  Patient: Patrick Moore  Procedure(s) Performed: COLONOSCOPY  Patient location during evaluation: Short Stay Anesthesia Type: MAC Level of consciousness: awake and alert Pain management: pain level controlled Vital Signs Assessment: post-procedure vital signs reviewed and stable Respiratory status: spontaneous breathing Cardiovascular status: blood pressure returned to baseline and stable Postop Assessment: no apparent nausea or vomiting Anesthetic complications: no   No notable events documented.   Last Vitals:  Vitals:   08/17/24 0757 08/17/24 0949  BP: (!) 161/97 120/73  Pulse:  69  Resp: 14 18  Temp: 36.9 C 36.7 C  SpO2: 100% 97%    Last Pain:  Vitals:   08/17/24 0951  TempSrc:   PainSc: 0-No pain                 Ulrich Soules      "

## 2024-08-17 NOTE — Anesthesia Preprocedure Evaluation (Signed)
 Anesthesia Evaluation  Patient identified by MRN, date of birth, ID band Patient awake    Reviewed: Allergy & Precautions, H&P , NPO status , Patient's Chart, lab work & pertinent test results, reviewed documented beta blocker date and time   Airway Mallampati: II  TM Distance: >3 FB Neck ROM: full    Dental no notable dental hx.    Pulmonary neg pulmonary ROS   Pulmonary exam normal breath sounds clear to auscultation       Cardiovascular Exercise Tolerance: Good hypertension, negative cardio ROS  Rhythm:regular Rate:Normal     Neuro/Psych negative neurological ROS  negative psych ROS   GI/Hepatic negative GI ROS, Neg liver ROS,,,  Endo/Other  negative endocrine ROS    Renal/GU negative Renal ROS  negative genitourinary   Musculoskeletal   Abdominal   Peds  Hematology negative hematology ROS (+)   Anesthesia Other Findings   Reproductive/Obstetrics negative OB ROS                              Anesthesia Physical Anesthesia Plan  ASA: 2  Anesthesia Plan: MAC   Post-op Pain Management:    Induction:   PONV Risk Score and Plan: Propofol  infusion  Airway Management Planned:   Additional Equipment:   Intra-op Plan:   Post-operative Plan:   Informed Consent: I have reviewed the patients History and Physical, chart, labs and discussed the procedure including the risks, benefits and alternatives for the proposed anesthesia with the patient or authorized representative who has indicated his/her understanding and acceptance.     Dental Advisory Given  Plan Discussed with: CRNA  Anesthesia Plan Comments:         Anesthesia Quick Evaluation

## 2024-08-18 ENCOUNTER — Encounter (HOSPITAL_COMMUNITY): Payer: Self-pay | Admitting: Internal Medicine
